# Patient Record
Sex: Female | Born: 1975 | Race: Black or African American | Hispanic: No | Marital: Single | Smoking: Never smoker
Health system: Southern US, Community
[De-identification: ages and names within clinical notes are randomized; demographics above are authoritative.]

## PROBLEM LIST (undated history)

## (undated) ENCOUNTER — Ambulatory Visit: Admission: EM | Payer: Medicaid Other

## (undated) DIAGNOSIS — Z803 Family history of malignant neoplasm of breast: Secondary | ICD-10-CM

## (undated) DIAGNOSIS — K589 Irritable bowel syndrome without diarrhea: Secondary | ICD-10-CM

## (undated) DIAGNOSIS — A6920 Lyme disease, unspecified: Secondary | ICD-10-CM

## (undated) DIAGNOSIS — Z8041 Family history of malignant neoplasm of ovary: Secondary | ICD-10-CM

## (undated) DIAGNOSIS — R079 Chest pain, unspecified: Secondary | ICD-10-CM

## (undated) DIAGNOSIS — K644 Residual hemorrhoidal skin tags: Secondary | ICD-10-CM

## (undated) DIAGNOSIS — J45909 Unspecified asthma, uncomplicated: Secondary | ICD-10-CM

## (undated) DIAGNOSIS — K219 Gastro-esophageal reflux disease without esophagitis: Secondary | ICD-10-CM

## (undated) HISTORY — DX: Residual hemorrhoidal skin tags: K64.4

## (undated) HISTORY — PX: TUBAL LIGATION: SHX77

## (undated) HISTORY — DX: Gastro-esophageal reflux disease without esophagitis: K21.9

## (undated) HISTORY — DX: Irritable bowel syndrome, unspecified: K58.9

## (undated) HISTORY — PX: CHOLECYSTECTOMY: SHX55

## (undated) HISTORY — DX: Family history of malignant neoplasm of breast: Z80.3

## (undated) HISTORY — DX: Lyme disease, unspecified: A69.20

## (undated) HISTORY — DX: Family history of malignant neoplasm of ovary: Z80.41

## (undated) HISTORY — DX: Chest pain, unspecified: R07.9

---

## 2017-10-11 LAB — HM HIV SCREENING LAB: HM HIV Screening: NEGATIVE

## 2017-12-25 ENCOUNTER — Emergency Department: Payer: Self-pay

## 2017-12-25 ENCOUNTER — Encounter: Payer: Self-pay | Admitting: Emergency Medicine

## 2017-12-25 ENCOUNTER — Other Ambulatory Visit: Payer: Self-pay

## 2017-12-25 ENCOUNTER — Emergency Department
Admission: EM | Admit: 2017-12-25 | Discharge: 2017-12-25 | Disposition: A | Discharge status: Home / Self Care | Payer: Self-pay | Attending: Emergency Medicine | Admitting: Emergency Medicine

## 2017-12-25 DIAGNOSIS — L732 Hidradenitis suppurativa: Secondary | ICD-10-CM | POA: Insufficient documentation

## 2017-12-25 DIAGNOSIS — J069 Acute upper respiratory infection, unspecified: Secondary | ICD-10-CM | POA: Insufficient documentation

## 2017-12-25 MED ORDER — BENZONATATE 100 MG PO CAPS: 100.0000 mg | ORAL_CAPSULE | Freq: Three times a day (TID) | ORAL | 0 refills | Status: DC | PRN

## 2017-12-25 MED ORDER — LIDOCAINE HCL (PF) 1 % IJ SOLN
5.0000 mL | Freq: Once | INTRAMUSCULAR | Status: AC
  Administered 2017-12-25: 5 mL via INTRADERMAL
  Filled 2017-12-25: qty 5

## 2017-12-25 MED ORDER — AZITHROMYCIN 250 MG PO TABS: ORAL_TABLET | ORAL | 0 refills | Status: DC

## 2017-12-25 MED ORDER — CLINDAMYCIN HCL 300 MG PO CAPS: 300.0000 mg | ORAL_CAPSULE | Freq: Three times a day (TID) | ORAL | 0 refills | Status: AC

## 2017-12-25 NOTE — ED Triage Notes (Signed)
First Nurse Note:  C/O productive cough of thick green sputum and right axilla tenderness / abscess.

## 2017-12-25 NOTE — ED Provider Notes (Signed)
[facility] Emergency Department Provider Note  ____________________________________________  Time seen: Approximately 1:34 PM  I have reviewed the triage vital signs and the nursing notes.   HISTORY  Chief Complaint Cough and Abscess    HPI Meredith Harris is a 42 y.o. female that presents to the emergency department for evaluation of productive cough with yellow and green sputum for 1 week and painful mass under right axilla for 3 days.  Patient has been using an albuterol inhaler without relief.  She had a mammogram done and was told that she has fluid pockets under her arms but has not had problems until now.  She denies fever, nausea, vomiting, abdominal pain.  History reviewed. No pertinent past medical history.  There are no active problems to display for this patient.   History reviewed. No pertinent surgical history.  Prior to Admission medications   Medication Sig Start Date End Date Taking? Authorizing Provider  azithromycin (ZITHROMAX Z-PAK) 250 MG tablet Take 2 tablets (500 mg) on  Day 1,  followed by 1 tablet (250 mg) once daily on Days 2 through 5. 12/25/17   Enid Derry, PA-C  benzonatate (TESSALON PERLES) 100 MG capsule Take 1 capsule (100 mg total) by mouth 3 (three) times daily as needed for cough. 12/25/17 12/25/18  Enid Derry, PA-C  clindamycin (CLEOCIN) 300 MG capsule Take 1 capsule (300 mg total) by mouth 3 (three) times daily for 10 days. 12/25/17 01/04/18  Enid Derry, PA-C    Allergies Penicillins and Sulfa antibiotics  No family history on file.  Social History Social History   Tobacco Use  . Smoking status: Not on file  Substance Use Topics  . Alcohol use: Not on file  . Drug use: Not on file     Review of Systems  Constitutional: No fever/chills ENT: Negative for congestion and rhinorrhea. Cardiovascular: No chest pain. Respiratory: Positive for cough.  Gastrointestinal: No abdominal pain.  No nausea, no  vomiting.  No diarrhea.  No constipation. Skin: Negative for  abrasions, lacerations, ecchymosis.   ____________________________________________   PHYSICAL EXAM:  VITAL SIGNS: ED Triage Vitals  Enc Vitals Group     BP 12/25/17 1248 (!) 158/95     Pulse Rate 12/25/17 1248 77     Resp 12/25/17 1248 20     Temp 12/25/17 1248 99.7 F (37.6 C)     Temp Source 12/25/17 1248 Oral     SpO2 12/25/17 1248 100 %     Weight 12/25/17 1249 199 lb (90.3 kg)     Height 12/25/17 1249 5\' 4"  (1.626 m)     Head Circumference --      Peak Flow --      Pain Score 12/25/17 1248 10     Pain Loc --      Pain Edu? --      Excl. in GC? --      Constitutional: Alert and oriented. Well appearing and in no acute distress. Eyes: Conjunctivae are normal. PERRL. EOMI. No discharge. Head: Atraumatic. ENT: No frontal and maxillary sinus tenderness.      Ears: Tympanic membranes pearly gray with good landmarks. No discharge.      Nose: No congestion/rhinnorhea.      Mouth/Throat: Mucous membranes are moist. Oropharynx non-erythematous. Tonsils not enlarged. No exudates. Uvula midline. Neck: No stridor.   Hematological/Lymphatic/Immunilogical: No cervical lymphadenopathy. Cardiovascular: Normal rate, regular rhythm.  Good peripheral circulation. Respiratory: Normal respiratory effort without tachypnea or retractions. Lungs CTAB. Good air entry to  the bases with no decreased or absent breath sounds. Gastrointestinal: Bowel sounds 4 quadrants. Soft and nontender to palpation. No guarding or rigidity. No palpable masses. No distention. Musculoskeletal: Full range of motion to all extremities. No gross deformities appreciated. Neurologic:  Normal speech and language. No gross focal neurologic deficits are appreciated.  Skin:  Skin is warm, dry. 1/2cm by 1/2cm fluctuant mass to left axilla.    ____________________________________________   LABS (all labs ordered are listed, but only abnormal results are  displayed)  Labs Reviewed - No data to display ____________________________________________  EKG   ____________________________________________  RADIOLOGY Lexine BatonI, Halo Shevlin, personally viewed and evaluated these images (plain radiographs) as part of my medical decision making, as well as reviewing the written report by the radiologist.  Dg Chest 2 View  Result Date: 12/25/2017 CLINICAL DATA:  Cough and productive sputum. EXAM: CHEST - 2 VIEW COMPARISON:  None. FINDINGS: Midline trachea.  Normal heart size and mediastinal contours. Sharp costophrenic angles.  No pneumothorax.  Clear lungs. Lateral view degraded by patient arm position. Cholecystectomy clips. IMPRESSION: No active cardiopulmonary disease. Electronically Signed   By: Jeronimo GreavesKyle  Talbot M.D.   On: 12/25/2017 13:54    ____________________________________________    PROCEDURES  Procedure(s) performed:    Procedures  INCISION AND DRAINAGE Performed by: Enid DerryAshley Suleyman Ehrman Consent: Verbal consent obtained. Risks and benefits: risks, benefits and alternatives were discussed Type: abscess  Body area: axilla  Anesthesia: local infiltration  Incision was made with a scalpel.  Local anesthetic: lidocaine 1% without epinephrine  Anesthetic total: 2 ml  Complexity: complex Blunt dissection to break up loculations  Drainage: purulent  Drainage amount: 2ccs  Packing material: 1/4 in iodoform gauze  Patient tolerance: Patient tolerated the procedure well with no immediate complications.   Medications  lidocaine (PF) (XYLOCAINE) 1 % injection 5 mL (5 mLs Intradermal Given by Other 12/25/17 1541)     ____________________________________________   INITIAL IMPRESSION / ASSESSMENT AND PLAN / ED COURSE  Pertinent labs & imaging results that were available during my care of the patient were reviewed by me and considered in my medical decision making (see chart for details).  Review of the [place] CSRS was performed in  accordance of the [facility] prior to dispensing any controlled drugs.   Patient's diagnosis is consistent with hydradenitis and URI with cough and congestions. Vital signs and exam are reassuring.  Chest x-ray negative for acute cardiopulmonary processes.  Abscess was drained and packed in ED. Patient appears well and is staying well hydrated. Patient will be discharged home with prescriptions for azithromycin, clindamycin, and tessalon pearles. Patient is to follow up with PCP as needed or otherwise directed. Patient is given ED precautions to return to the ED for any worsening or new symptoms.     ____________________________________________  FINAL CLINICAL IMPRESSION(S) / ED DIAGNOSES  Final diagnoses:  Hydradenitis  URI with cough and congestion      NEW MEDICATIONS STARTED DURING THIS VISIT:  ED Discharge Orders        Ordered    azithromycin (ZITHROMAX Z-PAK) 250 MG tablet     12/25/17 1512    clindamycin (CLEOCIN) 300 MG capsule  3 times daily     12/25/17 1512    benzonatate (TESSALON PERLES) 100 MG capsule  3 times daily PRN     12/25/17 1512          This chart was dictated using voice recognition software/Dragon. Despite best efforts to proofread, errors can occur which can change  the meaning. Any change was purely unintentional.    Enid Derry, PA-C 12/25/17 1608    Sharyn Creamer, MD 12/26/17 [phone]

## 2017-12-25 NOTE — ED Triage Notes (Signed)
Cough x 1 week. Sore under r arm x 2 days.

## 2018-03-09 ENCOUNTER — Ambulatory Visit: Payer: Self-pay | Admitting: Surgery

## 2018-03-10 ENCOUNTER — Ambulatory Visit: Payer: Self-pay | Admitting: Surgery

## 2018-03-15 ENCOUNTER — Telehealth: Payer: Self-pay | Admitting: General Practice

## 2018-03-15 NOTE — Telephone Encounter (Signed)
Unable to leave a message for the patient to call the office. I have mailed a letter for the patient to contact our office.

## 2018-03-16 ENCOUNTER — Other Ambulatory Visit: Payer: Self-pay

## 2018-03-16 ENCOUNTER — Emergency Department: Payer: 59

## 2018-03-16 ENCOUNTER — Encounter: Payer: Self-pay | Admitting: Emergency Medicine

## 2018-03-16 ENCOUNTER — Emergency Department
Admission: EM | Admit: 2018-03-16 | Discharge: 2018-03-17 | Disposition: A | Discharge status: Home / Self Care | Payer: 59 | Attending: Emergency Medicine | Admitting: Emergency Medicine

## 2018-03-16 DIAGNOSIS — R079 Chest pain, unspecified: Secondary | ICD-10-CM | POA: Diagnosis present

## 2018-03-16 LAB — BASIC METABOLIC PANEL
ANION GAP: 11 (ref 5–15)
BUN: 13 mg/dL (ref 6–20)
CHLORIDE: 104 mmol/L (ref 101–111)
CO2: 24 mmol/L (ref 22–32)
Calcium: 9.1 mg/dL (ref 8.9–10.3)
Creatinine, Ser: 1.03 mg/dL — ABNORMAL HIGH (ref 0.44–1.00)
GFR calc Af Amer: >60 mL/min (ref >60)
Glucose, Bld: 104 mg/dL — ABNORMAL HIGH (ref 65–99)
POTASSIUM: 3.3 mmol/L — AB (ref 3.5–5.1)
SODIUM: 139 mmol/L (ref 135–145)

## 2018-03-16 LAB — TROPONIN I: Troponin I: <0.03 ng/mL (ref <0.03)

## 2018-03-16 LAB — CBC
HEMATOCRIT: 38 % (ref 35.0–47.0)
HEMOGLOBIN: 12.8 g/dL (ref 12.0–16.0)
MCH: 29.6 pg (ref 26.0–34.0)
MCHC: 33.7 g/dL (ref 32.0–36.0)
MCV: 87.9 fL (ref 80.0–100.0)
Platelets: 359 10*3/uL (ref 150–440)
RBC: 4.33 MIL/uL (ref 3.80–5.20)
RDW: 13.7 % (ref 11.5–14.5)
WBC: 15.3 10*3/uL — AB (ref 3.6–11.0)

## 2018-03-16 MED ORDER — IOPAMIDOL (ISOVUE-370) INJECTION 76%
100.0000 mL | Freq: Once | INTRAVENOUS | Status: AC | PRN
  Administered 2018-03-16: 100 mL via INTRAVENOUS

## 2018-03-16 MED ORDER — SODIUM CHLORIDE 0.9 % IV BOLUS
1000.0000 mL | Freq: Once | INTRAVENOUS | Status: AC
  Administered 2018-03-16: 1000 mL via INTRAVENOUS

## 2018-03-16 MED ORDER — POTASSIUM CHLORIDE 20 MEQ PO PACK
40.0000 meq | PACK | Freq: Two times a day (BID) | ORAL | Status: DC
  Administered 2018-03-16: 40 meq via ORAL
  Filled 2018-03-16: qty 2

## 2018-03-16 NOTE — ED Triage Notes (Addendum)
Patient ambulatory to triage with steady gait, without difficulty, pt appears very anxious; Pt reports while at work, had sudden onset "feeling cold"; with upper chest tightness, pain that goes beneath right breast/axillae/back accomp by generalized HA and SHOB; denies hx of same

## 2018-03-17 LAB — TROPONIN I: Troponin I: <0.03 ng/mL (ref <0.03)

## 2018-03-17 MED ORDER — LORAZEPAM 1 MG PO TABS
ORAL_TABLET | ORAL | Status: AC
  Filled 2018-03-17: qty 1

## 2018-03-17 MED ORDER — ASPIRIN 81 MG PO CHEW
324.0000 mg | CHEWABLE_TABLET | Freq: Once | ORAL | Status: AC
  Administered 2018-03-17: 324 mg via ORAL
  Filled 2018-03-17: qty 4

## 2018-03-17 MED ORDER — LORAZEPAM 1 MG PO TABS
1.0000 mg | ORAL_TABLET | Freq: Once | ORAL | Status: AC
  Administered 2018-03-17: 1 mg via ORAL

## 2018-03-17 MED ORDER — KETOROLAC TROMETHAMINE 30 MG/ML IJ SOLN
30.0000 mg | Freq: Once | INTRAMUSCULAR | Status: AC
  Administered 2018-03-17: 30 mg via INTRAVENOUS
  Filled 2018-03-17: qty 1

## 2018-03-17 NOTE — ED Notes (Signed)
MD at bedside to discuss results from scan

## 2018-03-17 NOTE — ED Provider Notes (Signed)
[facility] Emergency Department Provider Note    First MD Initiated Contact with Patient 03/16/18 2304     (approximate)  I have reviewed the triage vital signs and the nursing notes.   HISTORY  Chief Complaint Chest Pain    HPI Meredith Harris is a 42 y.o. female presents to the emergency department acute onset central chest tightness radiating to left axilla and back.  Patient also admits to shortness of breath and headache.  Patient also admits to bilateral arm, periorbital numbness tingling and bluish discoloration during the event.  Patient states her current chest discomfort is 7 out of 10.  Patient denies any aggravating or alleviating factors.  Past medical history None There are no active problems to display for this patient.   Past Surgical History:  Procedure Laterality Date  . CHOLECYSTECTOMY      Prior to Admission medications   Medication Sig Start Date End Date Taking? Authorizing Provider  azithromycin (ZITHROMAX Z-PAK) 250 MG tablet Take 2 tablets (500 mg) on  Day 1,  followed by 1 tablet (250 mg) once daily on Days 2 through 5. 12/25/17   Enid DerryWagner, Ashley, PA-C  benzonatate (TESSALON PERLES) 100 MG capsule Take 1 capsule (100 mg total) by mouth 3 (three) times daily as needed for cough. 12/25/17 12/25/18  Enid DerryWagner, Ashley, PA-C    Allergies Penicillins and Sulfa antibiotics  No family history on file.  Social History Social History   Tobacco Use  . Smoking status: Never Smoker  . Smokeless tobacco: Never Used  Substance Use Topics  . Alcohol use: Not on file  . Drug use: Not on file    Review of Systems Constitutional: No fever/chills Eyes: No visual changes. ENT: No sore throat. Cardiovascular: Positive chest pain. Respiratory: Denies shortness of breath. Gastrointestinal: No abdominal pain.  No nausea, no vomiting.  No diarrhea.  No constipation. Genitourinary: Negative for dysuria. Musculoskeletal: Negative for neck  pain.  Negative for back pain. Integumentary: Negative for rash. Neurological: Negative for headaches, focal weakness or numbness.  ____________________________________________   PHYSICAL EXAM:  VITAL SIGNS: ED Triage Vitals  Enc Vitals Group     BP 03/16/18 2136 (!) 159/100     Pulse Rate 03/16/18 2136 (!) 110     Resp 03/16/18 2136 (!) 28     Temp 03/16/18 2136 99.7 F (37.6 C)     Temp Source 03/16/18 2136 Oral     SpO2 03/16/18 2136 98 %     Weight 03/16/18 2136 86.6 kg (191 lb)     Height 03/16/18 2136 1.651 m (5\' 5" )     Head Circumference --      Peak Flow --      Pain Score 03/16/18 2139 9     Pain Loc --      Pain Edu? --      Excl. in GC? --     Constitutional: Alert and oriented. Well appearing and in no acute distress. Eyes: Conjunctivae are normal. PERRL. EOMI. Head: Atraumatic. Mouth/Throat: Mucous membranes are moist.  Oropharynx non-erythematous. Neck: No stridor.   Cardiovascular: Tachycardia, regular rhythm. Good peripheral circulation. Grossly normal heart sounds. Respiratory: Tachypnea. no retractions. Lungs CTAB. Gastrointestinal: Soft and nontender. No distention.   Musculoskeletal: No lower extremity tenderness nor edema. No gross deformities of extremities. Neurologic:  Normal speech and language. No gross focal neurologic deficits are appreciated.  Skin:  Skin is warm, dry and intact. No rash noted. Psychiatric: Anxious affect . Speech and behavior  are normal.  ____________________________________________   LABS (all labs ordered are listed, but only abnormal results are displayed)  Labs Reviewed  BASIC METABOLIC PANEL - Abnormal; Notable for the following components:      Result Value   Potassium 3.3 (*)    Glucose, Bld 104 (*)    Creatinine, Ser 1.03 (*)    All other components within normal limits  CBC - Abnormal; Notable for the following components:   WBC 15.3 (*)    All other components within normal limits  TROPONIN I    ____________________________________________  EKG  ED ECG REPORT I, [place] Maelynn Moroney, the attending physician, personally viewed and interpreted this ECG.   Date: 03/17/2018  EKG Time: 9:41 PM  Rate: 110  Rhythm: Sinus tachycardia  Axis: Normal  Intervals: Normal  ST&T Change: None  ____________________________________________  RADIOLOGY I, [place] Chancy Claros, personally viewed and evaluated these images (plain radiographs) as part of my medical decision making, as well as reviewing the written report by the radiologist.  ED MD interpretation:  No active cardiopulmonary disease per radiologist  Official radiology report(s): Dg Chest 2 View  Result Date: 03/16/2018 CLINICAL DATA:  Sudden onset of feeling cold with upper chest tightness. EXAM: CHEST - 2 VIEW COMPARISON:  12/25/2017 FINDINGS: The heart size and mediastinal contours are within normal limits. Low lung volumes with slight crowding of interstitial lung markings. No alveolar consolidation, CHF, effusion or pneumothorax. The visualized skeletal structures are unremarkable. IMPRESSION: No active cardiopulmonary disease. Electronically Signed   By: Tollie Eth M.D.   On: 03/16/2018 22:02    __________________________________________ Procedures   ____________________________________________   INITIAL IMPRESSION / ASSESSMENT AND PLAN / ED COURSE  As part of my medical decision making, I reviewed the following data within the electronic MEDICAL RECORD NUMBER  41 year old female presenting with above-stated history and physical exam secondary to chest pain.  Consider the possibility of CAD and as such EKG and troponin was performed x2 which was negative.  In addition to consider the possibility of a pulmonary emboli or aortic aneurysm and a stat CT Angiulli chest was performed which revealed no evidence of PE or aortic aneurysm.  Patient very anxious during my evaluation and as such patient was given Ativan 1  mg. ____________________________________________  FINAL CLINICAL IMPRESSION(S) / ED DIAGNOSES  Final diagnoses:  Chest pain, unspecified type     MEDICATIONS GIVEN DURING THIS VISIT:  Medications  potassium chloride (KLOR-CON) packet 40 mEq (40 mEq Oral Given 03/16/18 2333)  sodium chloride 0.9 % bolus 1,000 mL (1,000 mLs Intravenous New Bag/Given 03/16/18 2333)  iopamidol (ISOVUE-370) 76 % injection 100 mL (100 mLs Intravenous Contrast Given 03/16/18 2348)     ED Discharge Orders    None       Note:  This document was prepared using Dragon voice recognition software and may include unintentional dictation errors.    Darci Current, MD 03/17/18 [phone]

## 2018-05-09 ENCOUNTER — Encounter: Payer: Self-pay | Admitting: Emergency Medicine

## 2018-05-09 ENCOUNTER — Emergency Department: Payer: 59

## 2018-05-09 ENCOUNTER — Other Ambulatory Visit: Payer: Self-pay

## 2018-05-09 ENCOUNTER — Inpatient Hospital Stay
Admission: EM | Admit: 2018-05-09 | Discharge: 2018-05-12 | DRG: 872 | Disposition: A | Discharge status: Home / Self Care | Payer: 59 | Attending: Internal Medicine | Admitting: Internal Medicine

## 2018-05-09 DIAGNOSIS — L732 Hidradenitis suppurativa: Secondary | ICD-10-CM | POA: Diagnosis present

## 2018-05-09 DIAGNOSIS — Z885 Allergy status to narcotic agent status: Secondary | ICD-10-CM | POA: Diagnosis not present

## 2018-05-09 DIAGNOSIS — N644 Mastodynia: Secondary | ICD-10-CM

## 2018-05-09 DIAGNOSIS — Z880 Allergy status to penicillin: Secondary | ICD-10-CM | POA: Diagnosis not present

## 2018-05-09 DIAGNOSIS — J45909 Unspecified asthma, uncomplicated: Secondary | ICD-10-CM | POA: Diagnosis present

## 2018-05-09 DIAGNOSIS — N61 Mastitis without abscess: Secondary | ICD-10-CM | POA: Diagnosis present

## 2018-05-09 DIAGNOSIS — A419 Sepsis, unspecified organism: Principal | ICD-10-CM | POA: Diagnosis present

## 2018-05-09 HISTORY — DX: Unspecified asthma, uncomplicated: J45.909

## 2018-05-09 LAB — COMPREHENSIVE METABOLIC PANEL
ALT: 18 U/L (ref 0–44)
AST: 25 U/L (ref 15–41)
Albumin: 4.6 g/dL (ref 3.5–5.0)
Alkaline Phosphatase: 67 U/L (ref 38–126)
Anion gap: 9 (ref 5–15)
BUN: 11 mg/dL (ref 6–20)
CHLORIDE: 107 mmol/L (ref 98–111)
CO2: 23 mmol/L (ref 22–32)
CREATININE: 0.99 mg/dL (ref 0.44–1.00)
Calcium: 9.5 mg/dL (ref 8.9–10.3)
GFR calc non Af Amer: >60 mL/min (ref >60)
Glucose, Bld: 119 mg/dL — ABNORMAL HIGH (ref 70–99)
POTASSIUM: 3.3 mmol/L — AB (ref 3.5–5.1)
SODIUM: 139 mmol/L (ref 135–145)
Total Bilirubin: 0.7 mg/dL (ref 0.3–1.2)
Total Protein: 7.9 g/dL (ref 6.5–8.1)

## 2018-05-09 LAB — URINE DRUG SCREEN, QUALITATIVE (ARMC ONLY)
AMPHETAMINES, UR SCREEN: NOT DETECTED
Barbiturates, Ur Screen: NOT DETECTED
CANNABINOID 50 NG, UR ~~LOC~~: NOT DETECTED
Cocaine Metabolite,Ur ~~LOC~~: NOT DETECTED
MDMA (ECSTASY) UR SCREEN: NOT DETECTED
Methadone Scn, Ur: NOT DETECTED
Opiate, Ur Screen: NOT DETECTED
PHENCYCLIDINE (PCP) UR S: NOT DETECTED
Tricyclic, Ur Screen: NOT DETECTED

## 2018-05-09 LAB — URINALYSIS, COMPLETE (UACMP) WITH MICROSCOPIC
BACTERIA UA: NONE SEEN
BILIRUBIN URINE: NEGATIVE
Glucose, UA: NEGATIVE mg/dL
KETONES UR: NEGATIVE mg/dL
Leukocytes, UA: NEGATIVE
Nitrite: NEGATIVE
PH: 6 (ref 5.0–8.0)
PROTEIN: NEGATIVE mg/dL
Specific Gravity, Urine: 1.015 (ref 1.005–1.030)

## 2018-05-09 LAB — POCT PREGNANCY, URINE: Preg Test, Ur: NEGATIVE

## 2018-05-09 LAB — TROPONIN I: Troponin I: <0.03 ng/mL (ref <0.03)

## 2018-05-09 LAB — CBC WITH DIFFERENTIAL/PLATELET
BASOS ABS: 0.1 10*3/uL (ref 0–0.1)
Basophils Relative: 1 %
Eosinophils Absolute: 0 10*3/uL (ref 0–0.7)
Eosinophils Relative: 0 %
HEMATOCRIT: 38.3 % (ref 35.0–47.0)
HEMOGLOBIN: 13.4 g/dL (ref 12.0–16.0)
LYMPHS ABS: 0.5 10*3/uL — AB (ref 1.0–3.6)
LYMPHS PCT: 4 %
MCH: 30.5 pg (ref 26.0–34.0)
MCHC: 34.9 g/dL (ref 32.0–36.0)
MCV: 87.3 fL (ref 80.0–100.0)
Monocytes Absolute: 0.7 10*3/uL (ref 0.2–0.9)
Monocytes Relative: 4 %
NEUTROS ABS: 14.2 10*3/uL — AB (ref 1.4–6.5)
NEUTROS PCT: 91 %
PLATELETS: 284 10*3/uL (ref 150–440)
RBC: 4.39 MIL/uL (ref 3.80–5.20)
RDW: 14 % (ref 11.5–14.5)
WBC: 15.5 10*3/uL — ABNORMAL HIGH (ref 3.6–11.0)

## 2018-05-09 LAB — INFLUENZA PANEL BY PCR (TYPE A & B)
INFLAPCR: NEGATIVE
INFLBPCR: NEGATIVE

## 2018-05-09 LAB — LACTIC ACID, PLASMA
LACTIC ACID, VENOUS: 2.3 mmol/L — AB (ref 0.5–1.9)
LACTIC ACID, VENOUS: 1.6 mmol/L (ref 0.5–1.9)

## 2018-05-09 MED ORDER — IBUPROFEN 400 MG PO TABS
400.0000 mg | ORAL_TABLET | Freq: Four times a day (QID) | ORAL | Status: DC | PRN
  Administered 2018-05-09 – 2018-05-10 (×2): 400 mg via ORAL
  Filled 2018-05-09 (×2): qty 1

## 2018-05-09 MED ORDER — ONDANSETRON HCL 4 MG/2ML IJ SOLN
INTRAMUSCULAR | Status: AC
  Administered 2018-05-09: 4 mg via INTRAVENOUS
  Filled 2018-05-09: qty 2

## 2018-05-09 MED ORDER — ACETAMINOPHEN 650 MG RE SUPP: 650.0000 mg | Freq: Four times a day (QID) | RECTAL | Status: DC | PRN

## 2018-05-09 MED ORDER — FENTANYL CITRATE (PF) 100 MCG/2ML IJ SOLN
100.0000 ug | Freq: Once | INTRAMUSCULAR | Status: AC
  Administered 2018-05-09: 100 ug via INTRAVENOUS

## 2018-05-09 MED ORDER — ONDANSETRON HCL 4 MG PO TABS: 4.0000 mg | ORAL_TABLET | Freq: Four times a day (QID) | ORAL | Status: DC | PRN

## 2018-05-09 MED ORDER — ONDANSETRON HCL 4 MG/2ML IJ SOLN: 4.0000 mg | Freq: Four times a day (QID) | INTRAMUSCULAR | Status: DC | PRN

## 2018-05-09 MED ORDER — FENTANYL CITRATE (PF) 100 MCG/2ML IJ SOLN
INTRAMUSCULAR | Status: AC
  Administered 2018-05-09: 100 ug via INTRAVENOUS
  Filled 2018-05-09: qty 2

## 2018-05-09 MED ORDER — SODIUM CHLORIDE 0.9 % IV SOLN
INTRAVENOUS | Status: DC
  Administered 2018-05-09 – 2018-05-12 (×5): via INTRAVENOUS

## 2018-05-09 MED ORDER — SODIUM CHLORIDE 0.9 % IV BOLUS
1000.0000 mL | Freq: Once | INTRAVENOUS | Status: AC
  Administered 2018-05-09: 1000 mL via INTRAVENOUS

## 2018-05-09 MED ORDER — SODIUM CHLORIDE 0.9 % IV SOLN
1.0000 g | Freq: Once | INTRAVENOUS | Status: AC
  Administered 2018-05-09: 1 g via INTRAVENOUS
  Filled 2018-05-09: qty 10

## 2018-05-09 MED ORDER — CLINDAMYCIN PHOSPHATE 300 MG/50ML IV SOLN
300.0000 mg | Freq: Four times a day (QID) | INTRAVENOUS | Status: DC
  Administered 2018-05-09 – 2018-05-12 (×12): 300 mg via INTRAVENOUS
  Filled 2018-05-09 (×16): qty 50

## 2018-05-09 MED ORDER — ACETAMINOPHEN 500 MG PO TABS
1000.0000 mg | ORAL_TABLET | Freq: Once | ORAL | Status: AC
  Administered 2018-05-09: 1000 mg via ORAL
  Filled 2018-05-09: qty 2

## 2018-05-09 MED ORDER — IOPAMIDOL (ISOVUE-370) INJECTION 76%
75.0000 mL | Freq: Once | INTRAVENOUS | Status: AC | PRN
  Administered 2018-05-09: 75 mL via INTRAVENOUS

## 2018-05-09 MED ORDER — ENOXAPARIN SODIUM 40 MG/0.4ML ~~LOC~~ SOLN
40.0000 mg | SUBCUTANEOUS | Status: DC
  Administered 2018-05-09 – 2018-05-11 (×3): 40 mg via SUBCUTANEOUS
  Filled 2018-05-09 (×3): qty 0.4

## 2018-05-09 MED ORDER — ACETAMINOPHEN 325 MG PO TABS
650.0000 mg | ORAL_TABLET | Freq: Four times a day (QID) | ORAL | Status: DC | PRN
  Administered 2018-05-10 – 2018-05-11 (×3): 650 mg via ORAL
  Filled 2018-05-09 (×3): qty 2

## 2018-05-09 MED ORDER — CLINDAMYCIN PHOSPHATE 900 MG/50ML IV SOLN
900.0000 mg | Freq: Once | INTRAVENOUS | Status: AC
  Administered 2018-05-09: 900 mg via INTRAVENOUS
  Filled 2018-05-09: qty 50

## 2018-05-09 MED ORDER — VANCOMYCIN HCL IN DEXTROSE 1-5 GM/200ML-% IV SOLN
1000.0000 mg | Freq: Once | INTRAVENOUS | Status: AC
  Administered 2018-05-09: 1000 mg via INTRAVENOUS
  Filled 2018-05-09: qty 200

## 2018-05-09 MED ORDER — VANCOMYCIN HCL IN DEXTROSE 1-5 GM/200ML-% IV SOLN
1000.0000 mg | Freq: Two times a day (BID) | INTRAVENOUS | Status: DC
  Administered 2018-05-09 – 2018-05-10 (×3): 1000 mg via INTRAVENOUS
  Filled 2018-05-09 (×5): qty 200

## 2018-05-09 MED ORDER — ONDANSETRON HCL 4 MG/2ML IJ SOLN
4.0000 mg | Freq: Once | INTRAMUSCULAR | Status: AC
  Administered 2018-05-09: 4 mg via INTRAVENOUS

## 2018-05-09 NOTE — H&P (Signed)
[facility] NAME: Meredith Harris    MR#:  [id number]  DATE OF BIRTH:  1975-11-12  DATE OF ADMISSION:  05/09/2018  PRIMARY CARE PHYSICIAN: Patient, No Pcp Per   REQUESTING/REFERRING PHYSICIAN: Dr. Merlyn Lot  CHIEF COMPLAINT:   Chief Complaint  Patient presents with  . Chills  . Back Pain  . Cyst    HISTORY OF PRESENT ILLNESS:  Meredith Harris  is a 42 y.o. female with a known history of asthma who presents to the hospital due to chills, fever, generalized body aches.  Patient says she was in her usual state of health when this morning patient developed significant body aches, chills and was noted to have a fever of 103.  Patient denies any upper respiratory symptoms of cough, congestion, nasal discharge or sore throat.  She also denies any GI symptoms of abdominal pain, nausea, vomiting.  She denies any diarrhea.  She presents to the ER and was noted to have sepsis criteria given her elevated lactic acid, leukocytosis fever of 103.  Her chest x-ray/CT chest abdomen pelvis is negative for acute pathology.  She does say that she has some pain on her right breast area and on examination her right axilla is suggestive of some mild hidradenitis with some drainage.  Hospitalist services were contacted for admission.  PAST MEDICAL HISTORY:   Past Medical History:  Diagnosis Date  . Asthma     PAST SURGICAL HISTORY:   Past Surgical History:  Procedure Laterality Date  . CHOLECYSTECTOMY      SOCIAL HISTORY:   Social History   Tobacco Use  . Smoking status: Never Smoker  . Smokeless tobacco: Never Used  Substance Use Topics  . Alcohol use: Yes    Comment: socially    FAMILY HISTORY:   Family History  Problem Relation Age of Onset  . Hypertension Mother   . Stroke Mother   . Hypertension Father     DRUG ALLERGIES:   Allergies  Allergen Reactions  . Morphine And Related Other (See Comments)    Restless legs  .  Penicillins Hives    Has patient had a PCN reaction causing immediate rash, facial/tongue/throat swelling, SOB or lightheadedness with hypotension: Yes Has patient had a PCN reaction causing severe rash involving mucus membranes or skin necrosis: Unknown Has patient had a PCN reaction that required hospitalization: Unknown Has patient had a PCN reaction occurring within the last 10 years: Unknown If all of the above answers are "NO", then may proceed with Cephalosporin use.   . Sulfa Antibiotics Hives    REVIEW OF SYSTEMS:   Review of Systems  Constitutional: Positive for chills and fever. Negative for weight loss.  HENT: Negative for congestion, nosebleeds and tinnitus.   Eyes: Negative for blurred vision, double vision and redness.  Respiratory: Negative for cough, hemoptysis and shortness of breath.   Cardiovascular: Negative for chest pain, orthopnea, leg swelling and PND.  Gastrointestinal: Negative for abdominal pain, diarrhea, melena, nausea and vomiting.  Genitourinary: Negative for dysuria, hematuria and urgency.  Musculoskeletal: Negative for falls and joint pain.  Neurological: Negative for dizziness, tingling, sensory change, focal weakness, seizures, weakness and headaches.  Endo/Heme/Allergies: Negative for polydipsia. Does not bruise/bleed easily.  Psychiatric/Behavioral: Negative for depression and memory loss. The patient is not nervous/anxious.     MEDICATIONS AT HOME:   Prior to Admission medications   Medication Sig Start Date End Date Taking? Authorizing Provider  azithromycin (ZITHROMAX Z-PAK)  250 MG tablet Take 2 tablets (500 mg) on  Day 1,  followed by 1 tablet (250 mg) once daily on Days 2 through 5. Patient not taking: Reported on 05/09/2018 12/25/17   Laban Emperor, PA-C  benzonatate (TESSALON PERLES) 100 MG capsule Take 1 capsule (100 mg total) by mouth 3 (three) times daily as needed for cough. Patient not taking: Reported on 05/09/2018 12/25/17 12/25/18   Laban Emperor, PA-C      VITAL SIGNS:  Blood pressure 100/60, pulse 92, temperature 98.9 F (37.2 C), temperature source Oral, resp. rate 19, height _0  (1.626 m), weight 86.6 kg, last menstrual period 04/28/2018, SpO2 100 %.  PHYSICAL EXAMINATION:  Physical Exam  GENERAL:  42 y.o.-year-old patient lying in the bed with no acute distress.  EYES: Pupils equal, round, reactive to light and accommodation. No scleral icterus. Extraocular muscles intact.  HEENT: Head atraumatic, normocephalic. Oropharynx and nasopharynx clear. No oropharyngeal erythema, moist oral mucosa  NECK:  Supple, no jugular venous distention. No thyroid enlargement, no tenderness.  LUNGS: Normal breath sounds bilaterally, no wheezing, rales, rhonchi. No use of accessory muscles of respiration.  CARDIOVASCULAR: S1, S2 RRR. No murmurs, rubs, gallops, clicks.  ABDOMEN: Soft, nontender, nondistended. Bowel sounds present. No organomegaly or mass.  EXTREMITIES: No pedal edema, cyanosis, or clubbing. + 2 pedal & radial pulses b/l.   NEUROLOGIC: Cranial nerves II through XII are intact. No focal Motor or sensory deficits appreciated b/l PSYCHIATRIC: The patient is alert and oriented x 3.  SKIN: No obvious rash, lesion, or ulcer.  Right axillary hidradenitis with some drainage.  Right breast has some mild erythema but no acute drainage or mastitis or nipple discharge noted.  LABORATORY PANEL:   CBC Recent Labs  Lab 05/09/18 1006  WBC 15.5*  HGB 13.4  HCT 38.3  PLT 284   ------------------------------------------------------------------------------------------------------------------  Chemistries  Recent Labs  Lab 05/09/18 1006  NA 139  K 3.3*  CL 107  CO2 23  GLUCOSE 119*  BUN 11  CREATININE 0.99  CALCIUM 9.5  AST 25  ALT 18  ALKPHOS 67  BILITOT 0.7   ------------------------------------------------------------------------------------------------------------------  Cardiac Enzymes Recent Labs   Lab 05/09/18 1006  TROPONINI <0.03   ------------------------------------------------------------------------------------------------------------------  RADIOLOGY:  Dg Chest 2 View  Result Date: 05/09/2018 CLINICAL DATA:  Right-sided chest pain and fever. EXAM: CHEST - 2 VIEW COMPARISON:  Chest x-ray and chest CT dated 03/16/2018 FINDINGS: The heart size and mediastinal contours are within normal limits. Both lungs are clear. The visualized skeletal structures are unremarkable. IMPRESSION: Normal exam. Electronically Signed   By: Lorriane Shire M.D.   On: 05/09/2018 10:45   Ct Chest W Contrast  Result Date: 05/09/2018 CLINICAL DATA:  Abdominal pain, low back pain, right breast pain, fever, and chills. EXAM: CT CHEST, ABDOMEN, AND PELVIS WITH CONTRAST TECHNIQUE: Multidetector CT imaging of the chest, abdomen and pelvis was performed following the standard protocol during bolus administration of intravenous contrast. CONTRAST:  10m ISOVUE-370 IOPAMIDOL (ISOVUE-370) INJECTION 76% COMPARISON:  Chest CT dated 03/16/2018 FINDINGS: CT CHEST FINDINGS Cardiovascular: No significant vascular findings. Normal heart size. No pericardial effusion. Mediastinum/Nodes: No enlarged mediastinal or hilar lymph nodes. Soft tissue stranding around a slightly prominent right axillary lymph node, unchanged. The node is not pathologically enlarged. Thyroid gland, trachea, and esophagus demonstrate no significant findings. Lungs/Pleura: Lungs are clear. No pleural effusion or pneumothorax. Musculoskeletal: No chest wall mass or suspicious bone lesions identified. CT ABDOMEN PELVIS FINDINGS Hepatobiliary: Hepatic steatosis. No focal liver lesions.  Cholecystectomy. No biliary ductal dilatation. Pancreas: Unremarkable. No pancreatic ductal dilatation or surrounding inflammatory changes. Spleen: Normal in size without focal abnormality. Adrenals/Urinary Tract: Adrenal glands are unremarkable. Kidneys are normal, without renal  calculi, focal lesion, or hydronephrosis. Bladder is unremarkable. Stomach/Bowel: Stomach is within normal limits. Appendix appears normal. No evidence of bowel wall thickening, distention, or inflammatory changes. Vascular/Lymphatic: No significant vascular findings are present. No enlarged abdominal or pelvic lymph nodes. Reproductive: Normal ovaries. 2.2 cm cyst in the region of the right cornu of the uterus. Other: No abdominal wall hernia or abnormality. No abdominopelvic ascites. Musculoskeletal: No acute or significant osseous findings. IMPRESSION: 1. Small inflamed right axillary lymph node, unchanged since the prior CT scan of 03/16/2018. Otherwise normal CT scan of the chest. 2. Hepatic steatosis with slight hepatomegaly. 3. 19 mm cyst in the region of the right cornua of the uterus. This could represent a degenerated fibroid or possibly a cornual hematometra. Has the patient had prior endometrial ablation? If not, ultrasound may be useful for further characterization. Electronically Signed   By: Lorriane Shire M.D.   On: 05/09/2018 12:21   Ct Abdomen Pelvis W Contrast  Result Date: 05/09/2018 CLINICAL DATA:  Abdominal pain, low back pain, right breast pain, fever, and chills. EXAM: CT CHEST, ABDOMEN, AND PELVIS WITH CONTRAST TECHNIQUE: Multidetector CT imaging of the chest, abdomen and pelvis was performed following the standard protocol during bolus administration of intravenous contrast. CONTRAST:  65m ISOVUE-370 IOPAMIDOL (ISOVUE-370) INJECTION 76% COMPARISON:  Chest CT dated 03/16/2018 FINDINGS: CT CHEST FINDINGS Cardiovascular: No significant vascular findings. Normal heart size. No pericardial effusion. Mediastinum/Nodes: No enlarged mediastinal or hilar lymph nodes. Soft tissue stranding around a slightly prominent right axillary lymph node, unchanged. The node is not pathologically enlarged. Thyroid gland, trachea, and esophagus demonstrate no significant findings. Lungs/Pleura: Lungs are  clear. No pleural effusion or pneumothorax. Musculoskeletal: No chest wall mass or suspicious bone lesions identified. CT ABDOMEN PELVIS FINDINGS Hepatobiliary: Hepatic steatosis. No focal liver lesions. Cholecystectomy. No biliary ductal dilatation. Pancreas: Unremarkable. No pancreatic ductal dilatation or surrounding inflammatory changes. Spleen: Normal in size without focal abnormality. Adrenals/Urinary Tract: Adrenal glands are unremarkable. Kidneys are normal, without renal calculi, focal lesion, or hydronephrosis. Bladder is unremarkable. Stomach/Bowel: Stomach is within normal limits. Appendix appears normal. No evidence of bowel wall thickening, distention, or inflammatory changes. Vascular/Lymphatic: No significant vascular findings are present. No enlarged abdominal or pelvic lymph nodes. Reproductive: Normal ovaries. 2.2 cm cyst in the region of the right cornu of the uterus. Other: No abdominal wall hernia or abnormality. No abdominopelvic ascites. Musculoskeletal: No acute or significant osseous findings. IMPRESSION: 1. Small inflamed right axillary lymph node, unchanged since the prior CT scan of 03/16/2018. Otherwise normal CT scan of the chest. 2. Hepatic steatosis with slight hepatomegaly. 3. 19 mm cyst in the region of the right cornua of the uterus. This could represent a degenerated fibroid or possibly a cornual hematometra. Has the patient had prior endometrial ablation? If not, ultrasound may be useful for further characterization. Electronically Signed   By: JLorriane ShireM.D.   On: 05/09/2018 12:21   [employer] Right [employer] Axilla  Result Date: 05/09/2018 CLINICAL DATA:  Right breast pain. EXAM: ULTRASOUND OF THE RIGHT BREAST COMPARISON:  None. FINDINGS: Static images from targeted right breast ultrasound, in the area of pain, 8 o'clock 8 cm from the nipple demonstrate no suspicious masses or fluid collections. Ultrasound examination of the right axilla demonstrates no evidence of  lymphadenopathy.  There is a superficial complex fluid collection with tract to the skin which measures 1.4 cm and likely represents a benign finding of dermal origin, such as a ruptured epidermal inclusion or sebaceous cyst. IMPRESSION: No sonographic correlation to the area of pain in patient's right breast at 8 o'clock. No breast abscess was seen. No evidence of right axillary lymphadenopathy. RECOMMENDATION: Clinical follow-up for patient's right breast pain. Bilateral mammogram is recommended, as soon as the patient is able to tolerate the compression necessary for the exam. I have discussed the findings and recommendations with the patient. Results were also provided in writing at the conclusion of the visit. If applicable, a reminder letter will be sent to the patient regarding the next appointment. BI-RADS CATEGORY  2: Benign. Electronically Signed   By: Fidela Salisbury M.D.   On: 05/09/2018 11:44     IMPRESSION AND PLAN:   42 year old female with past medical history of asthma who presents to the hospital due to generalized body aches, right breast pain.  Patient met sepsis criteria given her elevated lactic acid,'s fever, leukocytosis.  1.  Sepsis- patient meets criteria given her leukocytosis, fever, elevated lactic acid. - Source of her sepsis is unclear but suspected to be secondary to hidradenitis is seen on exam.  Patient's chest x-ray, urinalysis, CT chest abdomen and pelvis is really negative for any acute pathology. -We will empirically treated with vancomycin, clindamycin.  Patient is allergic to penicillin given her anaphylaxis.  Follow cultures.  2.  Hidradenitis-suspected source of patient's sepsis. -Continue antibiotics as mentioned above, will get surgical consult to see if she needs drainage.  3.  Asthma-no acute exacerbation.   -continue albuterol inhaler as needed.    All the records are reviewed and case discussed with ED provider. Management plans discussed with  the patient, family and they are in agreement.  CODE STATUS: Full code  TOTAL TIME TAKING CARE OF THIS PATIENT: 40 minutes.    Henreitta Leber M.D on 05/09/2018 at 1:25 PM  Between 7am to 6pm - Pager - [phone]  After 6pm go to [email or web address] - password [email or web address] [facility]  Office  [phone]  CC: Primary care physician; Patient, No Pcp Per

## 2018-05-09 NOTE — ED Triage Notes (Signed)
Pt states she woke up this am with fever, chills, lower back pain and right breast pain, states she has a cyst under her right arm that feels to be infected, draining puss yesterday.  General body aches as well. Denies any burning with urination or blood in urine.

## 2018-05-09 NOTE — ED Provider Notes (Signed)
[facility] Emergency Department Provider Note    First MD Initiated Contact with Patient 05/09/18 [phone]     (approximate)  I have reviewed the triage vital signs and the nursing notes.   HISTORY  Chief Complaint Chills; Back Pain; and Cyst    HPI Berdine Danceabitha Batton is a 42 y.o. female since the ER with chief complaint of dysuria right breast pain myalgias this started this morning.  States that she is been having progressive worsening pain and intermittent drainage from the right breast past several months.  Did reportedly have an abnormal mammogram 2 years ago but did not follow-up with this that she was supposed of had a biopsy.  States she has had adnexal abscesses that required drainage in the past and is noted draining from previous I&D site over the past few days.  Has had some nausea.  Some generalized abdominal pain.  Also having chest pain over the past month.    Past Medical History:  Diagnosis Date  . Asthma    FmHx:  Htn Past Surgical History:  Procedure Laterality Date  . CHOLECYSTECTOMY     There are no active problems to display for this patient.     Prior to Admission medications   Medication Sig Start Date End Date Taking? Authorizing Provider  azithromycin (ZITHROMAX Z-PAK) 250 MG tablet Take 2 tablets (500 mg) on  Day 1,  followed by 1 tablet (250 mg) once daily on Days 2 through 5. Patient not taking: Reported on 05/09/2018 12/25/17   Enid DerryWagner, Ashley, PA-C  benzonatate (TESSALON PERLES) 100 MG capsule Take 1 capsule (100 mg total) by mouth 3 (three) times daily as needed for cough. Patient not taking: Reported on 05/09/2018 12/25/17 12/25/18  Enid DerryWagner, Ashley, PA-C    Allergies Morphine and related; Penicillins; and Sulfa antibiotics    Social History Social History   Tobacco Use  . Smoking status: Never Smoker  . Smokeless tobacco: Never Used  Substance Use Topics  . Alcohol use: Not on file  . Drug use: Not on file    Review  of Systems Patient denies headaches, rhinorrhea, blurry vision, numbness, shortness of breath, chest pain, edema, cough, abdominal pain, nausea, vomiting, diarrhea, dysuria, fevers, rashes or hallucinations unless otherwise stated above in HPI. ____________________________________________   PHYSICAL EXAM:  VITAL SIGNS: Vitals:   05/09/18 0934  BP: 135/75  Pulse: (!) 115  Resp: 20  Temp: (!) 103.3 F (39.6 C)  SpO2: 99%    Constitutional: Alert and oriented. Ill appearing Eyes: Conjunctivae are normal.  Head: Atraumatic. Nose: No congestion/rhinnorhea. Mouth/Throat: Mucous membranes are moist.   Neck: No stridor. Painless ROM.  Cardiovascular: Normal rate, regular rhythm. Grossly normal heart sounds.  Good peripheral circulation. Respiratory: Normal respiratory effort.  No retractions. Lungs CTAB. Gastrointestinal: Soft and nontender. No distention. No abdominal bruits. No CVA tenderness. Genitourinary: deferred Musculoskeletal: No lower extremity tenderness nor edema.  No joint effusions. Neurologic:  Normal speech and language. No gross focal neurologic deficits are appreciated. No facial droop Skin:  Skin is warm, dry and intact. No rash noted.  Right adnexa with purulent drainage from 1cm area of fluctuance without surrounding erythma or crepitus,  TTP for right breast with fluctuant feeling mass at 8oclock and beneath areola Psychiatric: Mood and affect are normal. Speech and behavior are normal.  ____________________________________________   LABS (all labs ordered are listed, but only abnormal results are displayed)  Results for orders placed or performed during the hospital encounter of 05/09/18 (  from the past 24 hour(s))  Lactic acid, plasma     Status: Abnormal   Collection Time: 05/09/18 10:06 AM  Result Value Ref Range   Lactic Acid, Venous 2.3 (HH) 0.5 - 1.9 mmol/L  Comprehensive metabolic panel     Status: Abnormal   Collection Time: 05/09/18 10:06 AM  Result  Value Ref Range   Sodium 139 135 - 145 mmol/L   Potassium 3.3 (L) 3.5 - 5.1 mmol/L   Chloride 107 98 - 111 mmol/L   CO2 23 22 - 32 mmol/L   Glucose, Bld 119 (H) 70 - 99 mg/dL   BUN 11 6 - 20 mg/dL   Creatinine, Ser 0.860.99 0.44 - 1.00 mg/dL   Calcium 9.5 8.9 - 57.810.3 mg/dL   Total Protein 7.9 6.5 - 8.1 g/dL   Albumin 4.6 3.5 - 5.0 g/dL   AST 25 15 - 41 U/L   ALT 18 0 - 44 U/L   Alkaline Phosphatase 67 38 - 126 U/L   Total Bilirubin 0.7 0.3 - 1.2 mg/dL   GFR calc non Af Amer >60 >60 mL/min   GFR calc Af Amer >60 >60 mL/min   Anion gap 9 5 - 15  Troponin I     Status: None   Collection Time: 05/09/18 10:06 AM  Result Value Ref Range   Troponin I <0.03 <0.03 ng/mL  CBC WITH DIFFERENTIAL     Status: Abnormal   Collection Time: 05/09/18 10:06 AM  Result Value Ref Range   WBC 15.5 (H) 3.6 - 11.0 K/uL   RBC 4.39 3.80 - 5.20 MIL/uL   Hemoglobin 13.4 12.0 - 16.0 g/dL   HCT 46.938.3 62.935.0 - 52.847.0 %   MCV 87.3 80.0 - 100.0 fL   MCH 30.5 26.0 - 34.0 pg   MCHC 34.9 32.0 - 36.0 g/dL   RDW 41.314.0 24.411.5 - 01.014.5 %   Platelets 284 150 - 440 K/uL   Neutrophils Relative % 91 %   Neutro Abs 14.2 (H) 1.4 - 6.5 K/uL   Lymphocytes Relative 4 %   Lymphs Abs 0.5 (L) 1.0 - 3.6 K/uL   Monocytes Relative 4 %   Monocytes Absolute 0.7 0.2 - 0.9 K/uL   Eosinophils Relative 0 %   Eosinophils Absolute 0.0 0 - 0.7 K/uL   Basophils Relative 1 %   Basophils Absolute 0.1 0 - 0.1 K/uL  Urinalysis, Complete w Microscopic     Status: Abnormal   Collection Time: 05/09/18 10:06 AM  Result Value Ref Range   Color, Urine YELLOW (A) YELLOW   APPearance HAZY (A) CLEAR   Specific Gravity, Urine 1.015 1.005 - 1.030   pH 6.0 5.0 - 8.0   Glucose, UA NEGATIVE NEGATIVE mg/dL   Hgb urine dipstick SMALL (A) NEGATIVE   Bilirubin Urine NEGATIVE NEGATIVE   Ketones, ur NEGATIVE NEGATIVE mg/dL   Protein, ur NEGATIVE NEGATIVE mg/dL   Nitrite NEGATIVE NEGATIVE   Leukocytes, UA NEGATIVE NEGATIVE   RBC / HPF 0-5 0 - 5 RBC/hpf   WBC, UA  0-5 0 - 5 WBC/hpf   Bacteria, UA NONE SEEN NONE SEEN   Squamous Epithelial / LPF 6-10 0 - 5   Mucus PRESENT   Pregnancy, urine POC     Status: None   Collection Time: 05/09/18 10:11 AM  Result Value Ref Range   Preg Test, Ur NEGATIVE NEGATIVE   ____________________________________________  EKG My review and personal interpretation at Time: 10:03   Indication: chest pain  Rate: 115  Rhythm: sinus Axis: normal Other: inferolateral t wave inversions, unchanged from previous ____________________________________________  RADIOLOGY  I personally reviewed all radiographic images ordered to evaluate for the above acute complaints and reviewed radiology reports and findings.  These findings were personally discussed with the patient.  Please see medical record for radiology report.  ____________________________________________   PROCEDURES  Procedure(s) performed:  Procedures    Critical Care performed: no ____________________________________________   INITIAL IMPRESSION / ASSESSMENT AND PLAN / ED COURSE  Pertinent labs & imaging results that were available during my care of the patient were reviewed by me and considered in my medical decision making (see chart for details).   DDX: sepsis, mass, abscess, uti, pyelo, pna, bacteremia  Jayliana Valencia is a 42 y.o. who presents to the ED with symptoms as described above.  Patient febrile tachycardic and ill-appearing.  Blood was sent to the above differential.  The patient will be placed on continuous pulse oximetry and telemetry for monitoring.  Laboratory evaluation will be sent to evaluate for the above complaints.     Clinical Course as of May 10 1243  Mon May 09, 2018  1102 Patient does meet septic criteria therefore will continue with IV fluids as well as start IV antibiotics.  Urinalysis is clear.  She is receiving ultrasound of right breast and I am concerned for abscess cellulitis.  Will start antibiotic coverage for  cellulitis.   [PR]  1141 Discussed results of ultrasound of radiology.  No identifiable source cause asked patient's pain.  Based on her fever persistent pain will order CT imaging of the chest abdomen pelvis to evaluate for source of sepsis.   [PR]  1228 CT does not show any evidence of infectious source.  At this point given her presentation will   [PR]    Clinical Course User Index [PR] Willy Eddy, MD     As part of my medical decision making, I reviewed the following data within the electronic MEDICAL RECORD NUMBER Nursing notes reviewed and incorporated, Labs reviewed, notes from prior ED visits.   ____________________________________________   FINAL CLINICAL IMPRESSION(S) / ED DIAGNOSES  Final diagnoses:  Breast pain in female  Sepsis, due to unspecified organism (HCC)  Breast pain      NEW MEDICATIONS STARTED DURING THIS VISIT:  New Prescriptions   No medications on file     Note:  This document was prepared using Dragon voice recognition software and may include unintentional dictation errors.    Willy Eddy, MD 05/09/18 1245

## 2018-05-09 NOTE — Progress Notes (Signed)
Pharmacy Antibiotic Note  Meredith Harris is a 42 y.o. female admitted on 05/09/2018 with cellulitis and concern for abscess.  Pharmacy has been consulted for vancomycin dosing.  Plan: Vancomycin 1 gm IV x 1 in ED followed in approximately 6 hours (stacked dosing) by vancomycin 1 gm IV Q12H, predicted trough 16 mcg/ml. Pharmacy will continue to follow and adjust as needed to maintain trough 15 to 20 mcg/ml.   Vd 47 L, Ke 0.071 hr-1, T1/2 9.8 hr  Height: 5\' 4"  (162.6 cm) Weight: 191 lb (86.6 kg) IBW/kg (Calculated) : 54.7  Temp (24hrs), Avg:103.3 F (39.6 C), Min:103.3 F (39.6 C), Max:103.3 F (39.6 C)  Recent Labs  Lab 05/09/18 1006  WBC 15.5*  CREATININE 0.99  LATICACIDVEN 2.3*    Estimated Creatinine Clearance: 80.5 mL/min (by C-G formula based on SCr of 0.99 mg/dL).    Allergies  Allergen Reactions  . Morphine And Related Other (See Comments)    Restless legs  . Penicillins Hives    Has patient had a PCN reaction causing immediate rash, facial/tongue/throat swelling, SOB or lightheadedness with hypotension: Yes Has patient had a PCN reaction causing severe rash involving mucus membranes or skin necrosis: Unknown Has patient had a PCN reaction that required hospitalization: Unknown Has patient had a PCN reaction occurring within the last 10 years: Unknown If all of the above answers are "NO", then may proceed with Cephalosporin use.   . Sulfa Antibiotics Hives    Antimicrobials this admission:   Dose adjustments this admission:   Microbiology results:  BCx:   UCx:    Sputum:    MRSA PCR:   Thank you for allowing pharmacy to be a part of this patient's care.  Carola FrostNathan A Marijke Guadiana, Pharm.D., BCPS Clinical Pharmacist 05/09/2018 1:16 PM

## 2018-05-09 NOTE — Consult Note (Addendum)
Subjective:   CC: right hidradenitis  HPI:  Meredith Harris is a 42 y.o. female who was consulted by Hilda LiasVivek Sainani for evaluation of right axillary hidradenitis.  This has been a chronic issue for the patient and she has had an I&D before in the past.  She initially came into the emergency department for overall malaise and body aches.  Work-up included a picture of sepsis however no obvious source was noted through the initial work-up except for possible acute exacerbation of her hidradenitis.  Surgery was consulted for further management of this.  Patient states the overall pain in the right axilla has only slightly worsened and the discharge from the area is also not significant. She is complaining of extensive right breast pain stating that if you have warm to the touch.   Past Medical History:  has a past medical history of Asthma.  Past Surgical History:  has a past surgical history that includes Cholecystectomy.  Family History: family history includes Hypertension in her father and mother; Stroke in her mother.  Social History:  reports that she has never smoked. She has never used smokeless tobacco. She reports that she drinks alcohol. Her drug history is not on file.  Current Medications:  Medications Prior to Admission  Medication Sig Dispense Refill  . azithromycin (ZITHROMAX Z-PAK) 250 MG tablet Take 2 tablets (500 mg) on  Day 1,  followed by 1 tablet (250 mg) once daily on Days 2 through 5. (Patient not taking: Reported on 05/09/2018) 6 each 0  . benzonatate (TESSALON PERLES) 100 MG capsule Take 1 capsule (100 mg total) by mouth 3 (three) times daily as needed for cough. (Patient not taking: Reported on 05/09/2018) 30 capsule 0    Allergies:  Allergies  Allergen Reactions  . Morphine And Related Other (See Comments)    Restless legs  . Penicillins Hives    Has patient had a PCN reaction causing immediate rash, facial/tongue/throat swelling, SOB or lightheadedness with  hypotension: Yes Has patient had a PCN reaction causing severe rash involving mucus membranes or skin necrosis: Unknown Has patient had a PCN reaction that required hospitalization: Unknown Has patient had a PCN reaction occurring within the last 10 years: Unknown If all of the above answers are "NO", then may proceed with Cephalosporin use.   . Sulfa Antibiotics Hives    ROS:  A 15 point review of systems was performed and pertinent positives and negatives noted in HPI   Objective:     BP 109/76 (BP Location: Right Arm)   Pulse 93   Temp 98.9 F (37.2 C) (Oral)   Resp 19   Ht 5\' 4"  (1.626 m)   Wt 86.6 kg   LMP 04/28/2018 (Approximate)   SpO2 100%   BMI 32.79 kg/m   Constitutional :  alert, cooperative and appears stated age  Lymphatics/Throat:  no asymmetry, masses, or scars  Respiratory:  clear to auscultation bilaterally  Cardiovascular:  regular rate and rhythm  Gastrointestinal: soft, non-tender; bowel sounds normal; no masses,  no organomegaly.  Musculoskeletal: Steady gait and movement  Skin: Cool and moist.  Breast exam while chaperone present in the room showed increased erythema warmth and tenderness within her right breast.  Symptoms are generalized to the whole breasts and no focal point of induration or fluid collection palpable.  The symptoms are localized within the right breast only in the chest wall immediately beyond the are not involved.  No obvious tracking noted from her right axillary hidradenitis either  Psychiatric: Normal affect, non-agitated, not confused       LABS:  CMP Latest Ref Rng & Units 05/09/2018 03/16/2018  Glucose 70 - 99 mg/dL 811(B) 147(W)  BUN 6 - 20 mg/dL 11 13  Creatinine 2.95 - 1.00 mg/dL 6.21 3.08(M)  Sodium 578 - 145 mmol/L 139 139  Potassium 3.5 - 5.1 mmol/L 3.3(L) 3.3(L)  Chloride 98 - 111 mmol/L 107 104  CO2 22 - 32 mmol/L 23 24  Calcium 8.9 - 10.3 mg/dL 9.5 9.1  Total Protein 6.5 - 8.1 g/dL 7.9 -  Total Bilirubin 0.3 - 1.2  mg/dL 0.7 -  Alkaline Phos 38 - 126 U/L 67 -  AST 15 - 41 U/L 25 -  ALT 0 - 44 U/L 18 -   CBC Latest Ref Rng & Units 05/09/2018 03/16/2018  WBC 3.6 - 11.0 K/uL 15.5(H) 15.3(H)  Hemoglobin 12.0 - 16.0 g/dL 46.9 62.9  Hematocrit 52.8 - 47.0 % 38.3 38.0  Platelets 150 - 440 K/uL 284 359    RADS: CLINICAL DATA:  Abdominal pain, low back pain, right breast pain, fever, and chills.  EXAM: CT CHEST, ABDOMEN, AND PELVIS WITH CONTRAST  TECHNIQUE: Multidetector CT imaging of the chest, abdomen and pelvis was performed following the standard protocol during bolus administration of intravenous contrast.  CONTRAST:  75mL ISOVUE-370 IOPAMIDOL (ISOVUE-370) INJECTION 76%  COMPARISON:  Chest CT dated 03/16/2018  FINDINGS: CT CHEST FINDINGS  Cardiovascular: No significant vascular findings. Normal heart size. No pericardial effusion.  Mediastinum/Nodes: No enlarged mediastinal or hilar lymph nodes. Soft tissue stranding around a slightly prominent right axillary lymph node, unchanged. The node is not pathologically enlarged. Thyroid gland, trachea, and esophagus demonstrate no significant findings.  Lungs/Pleura: Lungs are clear. No pleural effusion or pneumothorax.  Musculoskeletal: No chest wall mass or suspicious bone lesions identified.  CT ABDOMEN PELVIS FINDINGS  Hepatobiliary: Hepatic steatosis. No focal liver lesions. Cholecystectomy. No biliary ductal dilatation.  Pancreas: Unremarkable. No pancreatic ductal dilatation or surrounding inflammatory changes.  Spleen: Normal in size without focal abnormality.  Adrenals/Urinary Tract: Adrenal glands are unremarkable. Kidneys are normal, without renal calculi, focal lesion, or hydronephrosis. Bladder is unremarkable.  Stomach/Bowel: Stomach is within normal limits. Appendix appears normal. No evidence of bowel wall thickening, distention, or inflammatory changes.  Vascular/Lymphatic: No significant  vascular findings are present. No enlarged abdominal or pelvic lymph nodes.  Reproductive: Normal ovaries. 2.2 cm cyst in the region of the right cornu of the uterus.  Other: No abdominal wall hernia or abnormality. No abdominopelvic ascites.  Musculoskeletal: No acute or significant osseous findings.  IMPRESSION: 1. Small inflamed right axillary lymph node, unchanged since the prior CT scan of 03/16/2018. Otherwise normal CT scan of the chest. 2. Hepatic steatosis with slight hepatomegaly. 3. 19 mm cyst in the region of the right cornua of the uterus. This could represent a degenerated fibroid or possibly a cornual hematometra. Has the patient had prior endometrial ablation? If not, ultrasound may be useful for further characterization.   Electronically Signed   By: Francene Boyers M.D.   On: 05/09/2018 12:21  CLINICAL DATA:  Abdominal pain, low back pain, right breast pain, fever, and chills.  EXAM: CT CHEST, ABDOMEN, AND PELVIS WITH CONTRAST  TECHNIQUE: Multidetector CT imaging of the chest, abdomen and pelvis was performed following the standard protocol during bolus administration of intravenous contrast.  CONTRAST:  75mL ISOVUE-370 IOPAMIDOL (ISOVUE-370) INJECTION 76%  COMPARISON:  Chest CT dated 03/16/2018  FINDINGS: CT CHEST FINDINGS  Cardiovascular: No significant  vascular findings. Normal heart size. No pericardial effusion.  Mediastinum/Nodes: No enlarged mediastinal or hilar lymph nodes. Soft tissue stranding around a slightly prominent right axillary lymph node, unchanged. The node is not pathologically enlarged. Thyroid gland, trachea, and esophagus demonstrate no significant findings.  Lungs/Pleura: Lungs are clear. No pleural effusion or pneumothorax.  Musculoskeletal: No chest wall mass or suspicious bone lesions identified.  CT ABDOMEN PELVIS FINDINGS  Hepatobiliary: Hepatic steatosis. No focal liver  lesions. Cholecystectomy. No biliary ductal dilatation.  Pancreas: Unremarkable. No pancreatic ductal dilatation or surrounding inflammatory changes.  Spleen: Normal in size without focal abnormality.  Adrenals/Urinary Tract: Adrenal glands are unremarkable. Kidneys are normal, without renal calculi, focal lesion, or hydronephrosis. Bladder is unremarkable.  Stomach/Bowel: Stomach is within normal limits. Appendix appears normal. No evidence of bowel wall thickening, distention, or inflammatory changes.  Vascular/Lymphatic: No significant vascular findings are present. No enlarged abdominal or pelvic lymph nodes.  Reproductive: Normal ovaries. 2.2 cm cyst in the region of the right cornu of the uterus.  Other: No abdominal wall hernia or abnormality. No abdominopelvic ascites.  Musculoskeletal: No acute or significant osseous findings.  IMPRESSION: 1. Small inflamed right axillary lymph node, unchanged since the prior CT scan of 03/16/2018. Otherwise normal CT scan of the chest. 2. Hepatic steatosis with slight hepatomegaly. 3. 19 mm cyst in the region of the right cornua of the uterus. This could represent a degenerated fibroid or possibly a cornual hematometra. Has the patient had prior endometrial ablation? If not, ultrasound may be useful for further characterization.   Electronically Signed   By: Francene BoyersJames  Maxwell M.D.   On: 05/09/2018 12:21   Assessment:     Right axillary hidradenitis chronic mild exacerbation per patient report which is also consistent with the physical exam.  She also has right breast cellulitis.  she also is currently septic with no obvious source of infection.   Plan:    Discussed incision and drainage at bedside of her right axillary hidradenitis. 1. Alternatives include continued observation.  Benefits include possible symptom relief, pathologic evaluation, improved cosmesis. Discussed the risk of surgery including recurrence,  chronic pain, post-op infxn, poor cosmesis, poor/delayed wound healing, and possible re-operation to address said risks.   The patient verbalized understanding and all questions were answered to the patient's satisfaction.  She states that due to a poor experience with the previous bedside I&D she would like to defer the procedure for now and to pursue IV antibiotic therapy initially.  Surgery service will continue to see her while in-house to monitor any change in her hidradenitis symptoms and to offer any possible surgical intervention if the patient wishes.

## 2018-05-10 LAB — BASIC METABOLIC PANEL
ANION GAP: 5 (ref 5–15)
BUN: 12 mg/dL (ref 6–20)
CO2: 24 mmol/L (ref 22–32)
CREATININE: 1.02 mg/dL — AB (ref 0.44–1.00)
Calcium: 8.1 mg/dL — ABNORMAL LOW (ref 8.9–10.3)
Chloride: 109 mmol/L (ref 98–111)
Glucose, Bld: 111 mg/dL — ABNORMAL HIGH (ref 70–99)
Potassium: 3.2 mmol/L — ABNORMAL LOW (ref 3.5–5.1)
Sodium: 138 mmol/L (ref 135–145)

## 2018-05-10 LAB — CBC
HCT: 32 % — ABNORMAL LOW (ref 35.0–47.0)
HEMOGLOBIN: 11.2 g/dL — AB (ref 12.0–16.0)
MCH: 30.2 pg (ref 26.0–34.0)
MCHC: 35 g/dL (ref 32.0–36.0)
MCV: 86.5 fL (ref 80.0–100.0)
PLATELETS: 241 10*3/uL (ref 150–440)
RBC: 3.69 MIL/uL — AB (ref 3.80–5.20)
RDW: 14.2 % (ref 11.5–14.5)
WBC: 17.9 10*3/uL — ABNORMAL HIGH (ref 3.6–11.0)

## 2018-05-10 MED ORDER — KETOROLAC TROMETHAMINE 30 MG/ML IJ SOLN
30.0000 mg | Freq: Four times a day (QID) | INTRAMUSCULAR | Status: DC | PRN
  Administered 2018-05-10: 30 mg via INTRAVENOUS
  Filled 2018-05-10: qty 1

## 2018-05-10 MED ORDER — POTASSIUM CHLORIDE CRYS ER 20 MEQ PO TBCR
40.0000 meq | EXTENDED_RELEASE_TABLET | Freq: Once | ORAL | Status: AC
  Administered 2018-05-10: 40 meq via ORAL
  Filled 2018-05-10: qty 2

## 2018-05-10 MED ORDER — FENTANYL CITRATE (PF) 100 MCG/2ML IJ SOLN
12.5000 ug | INTRAMUSCULAR | Status: DC | PRN
  Administered 2018-05-10: 12.5 ug via INTRAVENOUS
  Filled 2018-05-10: qty 2

## 2018-05-10 MED ORDER — KETOROLAC TROMETHAMINE 15 MG/ML IJ SOLN
15.0000 mg | Freq: Four times a day (QID) | INTRAMUSCULAR | Status: DC | PRN
  Administered 2018-05-11: 15 mg via INTRAVENOUS
  Filled 2018-05-10: qty 1

## 2018-05-10 MED ORDER — HYDROMORPHONE HCL 1 MG/ML IJ SOLN
0.5000 mg | INTRAMUSCULAR | Status: DC | PRN
  Administered 2018-05-10: 0.5 mg via INTRAVENOUS
  Filled 2018-05-10: qty 0.5

## 2018-05-10 NOTE — Progress Notes (Addendum)
[facility] [place] NAME: Meredith Harris    MR#:  [id number]  DATE OF BIRTH:  Aug 01, 1976  SUBJECTIVE:  CHIEF COMPLAINT: Is reporting right axillary and breast pain.  She had mammogram 3- 4 years ago which was normal.  Seen by surgery today No fever today  REVIEW OF SYSTEMS:  CONSTITUTIONAL: No fever, fatigue or weakness.  EYES: No blurred or double vision.  EARS, NOSE, AND THROAT: No tinnitus or ear pain.  RESPIRATORY: No cough, shortness of breath, wheezing or hemoptysis.  CARDIOVASCULAR: No chest pain, orthopnea, edema.  GASTROINTESTINAL: No nausea, vomiting, diarrhea or abdominal pain.  GENITOURINARY: No dysuria, hematuria.  ENDOCRINE: No polyuria, nocturia,  HEMATOLOGY: No anemia, easy bruising or bleeding SKIN: No rash or lesion. MUSCULOSKELETAL: No joint pain or arthritis.   NEUROLOGIC: No tingling, numbness, weakness.  PSYCHIATRY: No anxiety or depression.   DRUG ALLERGIES:   Allergies  Allergen Reactions  . Morphine And Related Other (See Comments)    Restless legs  . Penicillins Hives    Has patient had a PCN reaction causing immediate rash, facial/tongue/throat swelling, SOB or lightheadedness with hypotension: Yes Has patient had a PCN reaction causing severe rash involving mucus membranes or skin necrosis: Unknown Has patient had a PCN reaction that required hospitalization: Unknown Has patient had a PCN reaction occurring within the last 10 years: Unknown If all of the above answers are "NO", then may proceed with Cephalosporin use.   . Sulfa Antibiotics Hives    VITALS:  Blood pressure 104/66, pulse 67, temperature 99.1 F (37.3 C), temperature source Oral, resp. rate 18, height '5\' 4"'  (1.626 m), weight 86.6 kg, last menstrual period 04/28/2018, SpO2 99 %.  PHYSICAL EXAMINATION:  GENERAL:  42 y.o.-year-old patient lying in the bed with no acute distress.  EYES: Pupils equal, round, reactive to light and  accommodation. No scleral icterus. Extraocular muscles intact.  HEENT: Head atraumatic, normocephalic. Oropharynx and nasopharynx clear.  NECK:  Supple, no jugular venous distention. No thyroid enlargement, no tenderness.  LUNGS: Normal breath sounds bilaterally, no wheezing, rales,rhonchi or crepitation. No use of accessory muscles of respiration.  CARDIOVASCULAR: S1, S2 normal. No murmurs, rubs, or gallops.  ABDOMEN: Soft, nontender, nondistended. Bowel sounds present.  EXTREMITIES: No pedal edema, cyanosis, or clubbing.  NEUROLOGIC: Cranial nerves II through XII are intact. Muscle strength 5/5 in all extremities. Sensation intact. Gait not checked.  PSYCHIATRIC: The patient is alert and oriented x 3.  SKIN: Right axillary area with hidradenitis , tender, no drainage noticed today.  Right breast is tender to touch, no discharge from the nipple no retraction of the nipple, no rash noticed   LABORATORY PANEL:   CBC Recent Labs  Lab 05/10/18 0356  WBC 17.9*  HGB 11.2*  HCT 32.0*  PLT 241   ------------------------------------------------------------------------------------------------------------------  Chemistries  Recent Labs  Lab 05/09/18 1006 05/10/18 0356  NA 139 138  K 3.3* 3.2*  CL 107 109  CO2 23 24  GLUCOSE 119* 111*  BUN 11 12  CREATININE 0.99 1.02*  CALCIUM 9.5 8.1*  AST 25  --   ALT 18  --   ALKPHOS 67  --   BILITOT 0.7  --    ------------------------------------------------------------------------------------------------------------------  Cardiac Enzymes Recent Labs  Lab 05/09/18 1006  TROPONINI <0.03   ------------------------------------------------------------------------------------------------------------------  RADIOLOGY:  Dg Chest 2 View  Result Date: 05/09/2018 CLINICAL DATA:  Right-sided chest pain and fever. EXAM: CHEST - 2 VIEW COMPARISON:  Chest x-ray  and chest CT dated 03/16/2018 FINDINGS: The heart size and mediastinal contours are  within normal limits. Both lungs are clear. The visualized skeletal structures are unremarkable. IMPRESSION: Normal exam. Electronically Signed   By: Lorriane Shire M.D.   On: 05/09/2018 10:45   Ct Chest W Contrast  Result Date: 05/09/2018 CLINICAL DATA:  Abdominal pain, low back pain, right breast pain, fever, and chills. EXAM: CT CHEST, ABDOMEN, AND PELVIS WITH CONTRAST TECHNIQUE: Multidetector CT imaging of the chest, abdomen and pelvis was performed following the standard protocol during bolus administration of intravenous contrast. CONTRAST:  106m ISOVUE-370 IOPAMIDOL (ISOVUE-370) INJECTION 76% COMPARISON:  Chest CT dated 03/16/2018 FINDINGS: CT CHEST FINDINGS Cardiovascular: No significant vascular findings. Normal heart size. No pericardial effusion. Mediastinum/Nodes: No enlarged mediastinal or hilar lymph nodes. Soft tissue stranding around a slightly prominent right axillary lymph node, unchanged. The node is not pathologically enlarged. Thyroid gland, trachea, and esophagus demonstrate no significant findings. Lungs/Pleura: Lungs are clear. No pleural effusion or pneumothorax. Musculoskeletal: No chest wall mass or suspicious bone lesions identified. CT ABDOMEN PELVIS FINDINGS Hepatobiliary: Hepatic steatosis. No focal liver lesions. Cholecystectomy. No biliary ductal dilatation. Pancreas: Unremarkable. No pancreatic ductal dilatation or surrounding inflammatory changes. Spleen: Normal in size without focal abnormality. Adrenals/Urinary Tract: Adrenal glands are unremarkable. Kidneys are normal, without renal calculi, focal lesion, or hydronephrosis. Bladder is unremarkable. Stomach/Bowel: Stomach is within normal limits. Appendix appears normal. No evidence of bowel wall thickening, distention, or inflammatory changes. Vascular/Lymphatic: No significant vascular findings are present. No enlarged abdominal or pelvic lymph nodes. Reproductive: Normal ovaries. 2.2 cm cyst in the region of the right  cornu of the uterus. Other: No abdominal wall hernia or abnormality. No abdominopelvic ascites. Musculoskeletal: No acute or significant osseous findings. IMPRESSION: 1. Small inflamed right axillary lymph node, unchanged since the prior CT scan of 03/16/2018. Otherwise normal CT scan of the chest. 2. Hepatic steatosis with slight hepatomegaly. 3. 19 mm cyst in the region of the right cornua of the uterus. This could represent a degenerated fibroid or possibly a cornual hematometra. Has the patient had prior endometrial ablation? If not, ultrasound may be useful for further characterization. Electronically Signed   By: JLorriane ShireM.D.   On: 05/09/2018 12:21   Ct Abdomen Pelvis W Contrast  Result Date: 05/09/2018 CLINICAL DATA:  Abdominal pain, low back pain, right breast pain, fever, and chills. EXAM: CT CHEST, ABDOMEN, AND PELVIS WITH CONTRAST TECHNIQUE: Multidetector CT imaging of the chest, abdomen and pelvis was performed following the standard protocol during bolus administration of intravenous contrast. CONTRAST:  782mISOVUE-370 IOPAMIDOL (ISOVUE-370) INJECTION 76% COMPARISON:  Chest CT dated 03/16/2018 FINDINGS: CT CHEST FINDINGS Cardiovascular: No significant vascular findings. Normal heart size. No pericardial effusion. Mediastinum/Nodes: No enlarged mediastinal or hilar lymph nodes. Soft tissue stranding around a slightly prominent right axillary lymph node, unchanged. The node is not pathologically enlarged. Thyroid gland, trachea, and esophagus demonstrate no significant findings. Lungs/Pleura: Lungs are clear. No pleural effusion or pneumothorax. Musculoskeletal: No chest wall mass or suspicious bone lesions identified. CT ABDOMEN PELVIS FINDINGS Hepatobiliary: Hepatic steatosis. No focal liver lesions. Cholecystectomy. No biliary ductal dilatation. Pancreas: Unremarkable. No pancreatic ductal dilatation or surrounding inflammatory changes. Spleen: Normal in size without focal abnormality.  Adrenals/Urinary Tract: Adrenal glands are unremarkable. Kidneys are normal, without renal calculi, focal lesion, or hydronephrosis. Bladder is unremarkable. Stomach/Bowel: Stomach is within normal limits. Appendix appears normal. No evidence of bowel wall thickening, distention, or inflammatory changes. Vascular/Lymphatic: No significant vascular findings are  present. No enlarged abdominal or pelvic lymph nodes. Reproductive: Normal ovaries. 2.2 cm cyst in the region of the right cornu of the uterus. Other: No abdominal wall hernia or abnormality. No abdominopelvic ascites. Musculoskeletal: No acute or significant osseous findings. IMPRESSION: 1. Small inflamed right axillary lymph node, unchanged since the prior CT scan of 03/16/2018. Otherwise normal CT scan of the chest. 2. Hepatic steatosis with slight hepatomegaly. 3. 19 mm cyst in the region of the right cornua of the uterus. This could represent a degenerated fibroid or possibly a cornual hematometra. Has the patient had prior endometrial ablation? If not, ultrasound may be useful for further characterization. Electronically Signed   By: Lorriane Shire M.D.   On: 05/09/2018 12:21   US Breast Ltd Uni Right Inc Axilla  Result Date: 05/09/2018 CLINICAL DATA:  Right breast pain. EXAM: ULTRASOUND OF THE RIGHT BREAST COMPARISON:  None. FINDINGS: Static images from targeted right breast ultrasound, in the area of pain, 8 o'clock 8 cm from the nipple demonstrate no suspicious masses or fluid collections. Ultrasound examination of the right axilla demonstrates no evidence of lymphadenopathy. There is a superficial complex fluid collection with tract to the skin which measures 1.4 cm and likely represents a benign finding of dermal origin, such as a ruptured epidermal inclusion or sebaceous cyst. IMPRESSION: No sonographic correlation to the area of pain in patient's right breast at 8 o'clock. No breast abscess was seen. No evidence of right axillary  lymphadenopathy. RECOMMENDATION: Clinical follow-up for patient's right breast pain. Bilateral mammogram is recommended, as soon as the patient is able to tolerate the compression necessary for the exam. I have discussed the findings and recommendations with the patient. Results were also provided in writing at the conclusion of the visit. If applicable, a reminder letter will be sent to the patient regarding the next appointment. BI-RADS CATEGORY  2: Benign. Electronically Signed   By: Fidela Salisbury M.D.   On: 05/09/2018 11:44    EKG:   Orders placed or performed during the hospital encounter of 05/09/18  . ED EKG  . ED EKG  . EKG 12-Lead  . EKG 12-Lead    ASSESSMENT AND PLAN:    42 year old female with past medical history of asthma who presents to the hospital due to generalized body aches, right breast pain.  Patient met sepsis criteria given her elevated lactic acid,'s fever, leukocytosis.  1.  Sepsis- patient met criteria given her leukocytosis, fever, elevated lactic acid. - Source of her sepsis is unclear but suspected to be secondary to hidradenitis is seen on exam. -Lactic acid is trending down 2.3-1.6 -Still has leukocytosis WBC 15-17.9 -  Patient's chest x-ray, urinalysis, CT chest abdomen and pelvis is really negative for any acute pathology. -We will empirically treated with vancomycin, clindamycin.   -Patient is allergic to penicillin given her anaphylaxis.  -Blood cultures are negative so far  2.  Hidradenitis-suspected source of patient's sepsis. -Continue antibiotics as mentioned above -Seen and evaluated by surgery Dr. Lysle Pearl, patient is not considering IND at this time.  Plan is to continue IV antibiotics and monitor patient clinically   3.  Asthma-no acute exacerbation.   -continue albuterol inhaler as needed.  4.  Right breast tenderness Ultrasound is normal.  Outpatient mammogram is recommended  5.  Cyst in the uterus-outpatient follow-up with  GYN  All the records are reviewed and case discussed with Care Management/Social Workerr. Management plans discussed with the patient, family and they are in agreement.  CODE  STATUS: fc  TOTAL TIME TAKING CARE OF THIS PATIENT: 36 minutes.   POSSIBLE D/C IN 1-2DAYS, DEPENDING ON CLINICAL CONDITION.  Note: This dictation was prepared with Dragon dictation along with smaller phrase technology. Any transcriptional errors that result from this process are unintentional.   Nicholes Mango M.D on 05/10/2018 at 3:40 PM  Between 7am to 6pm - Pager - [phone] After 6pm go to [email or web address] - password [email or web address] [facility]  Office  [phone]  CC: Primary care physician; Patient, No Pcp Per

## 2018-05-10 NOTE — Progress Notes (Addendum)
Subjective:    Meredith Harris is a 42 y.o. female  Hospital stay day 1,   sepsis, possible right axillary hidradinitis, right breast cellulits  States she feels slightly better.  No major change in axilla or breast symptoms  ROS:  A 5 point review of systems was performed and pertinent positives and negatives noted in HPI.  Objective:      Temp:  [98.7 F (37.1 C)-103.3 F (39.6 C)] 99.1 F (37.3 C) (08/13 0511) Pulse Rate:  [62-115] 67 (08/13 0511) Resp:  [16-27] 18 (08/13 0511) BP: (100-135)/(60-76) 104/66 (08/13 0511) SpO2:  [99 %-100 %] 99 % (08/13 0511) Weight:  [86.6 kg] 86.6 kg (08/12 0935)     Height: 5\' 4"  (162.6 cm) Weight: 86.6 kg BMI (Calculated): 32.77   Intake/Output this shift:  Total I/O In: 1483.9 [I.V.:1083.9; IV Piggyback:400] Out: 1000 [Urine:1000]       Constitutional :  alert, cooperative, appears stated age and no distress  Respiratory:  clear to auscultation bilaterally  Cardiovascular:  regular rate and rhythm  Gastrointestinal: soft, non-tender; bowel sounds normal; no masses,  no organomegaly.   Skin: Cool and moist. Chaperone present for exam.  Right axilla with unchanged amount of drainage, slightly softening induration?, no increase in tenderness.  Right breast exam erythema unchanged from yesterday.  No obvious discharge but there is slightly more focal tenderness at the 5 o'clock position to the nipple areolar border  Psychiatric: Normal affect, non-agitated, not confused       LABS:  CMP Latest Ref Rng & Units 05/10/2018 05/09/2018 03/16/2018  Glucose 70 - 99 mg/dL 829(F111(H) 621(H119(H) 086(V104(H)  BUN 6 - 20 mg/dL 12 11 13   Creatinine 0.44 - 1.00 mg/dL 7.84(O1.02(H) 9.620.99 9.52(W1.03(H)  Sodium 135 - 145 mmol/L 138 139 139  Potassium 3.5 - 5.1 mmol/L 3.2(L) 3.3(L) 3.3(L)  Chloride 98 - 111 mmol/L 109 107 104  CO2 22 - 32 mmol/L 24 23 24   Calcium 8.9 - 10.3 mg/dL 8.1(L) 9.5 9.1  Total Protein 6.5 - 8.1 g/dL - 7.9 -  Total Bilirubin 0.3 - 1.2 mg/dL - 0.7 -   Alkaline Phos 38 - 126 U/L - 67 -  AST 15 - 41 U/L - 25 -  ALT 0 - 44 U/L - 18 -   CBC Latest Ref Rng & Units 05/10/2018 05/09/2018 03/16/2018  WBC 3.6 - 11.0 K/uL 17.9(H) 15.5(H) 15.3(H)  Hemoglobin 12.0 - 16.0 g/dL 11.2(L) 13.4 12.8  Hematocrit 35.0 - 47.0 % 32.0(L) 38.3 38.0  Platelets 150 - 440 K/uL 241 284 359    RADS: n/a Assessment:   sepsis, possible right axillary hidradinitis, right breast cellulits.  We again discussed the risk benefits alternatives to incision and drainage of the right axillary hidradenitis.  Patient still would like to continue watchful waiting at this time.    With the tenderness noted in the right breast that is potentially worse, will need to continue to monitor for new abscess formation, US from yesterday noted with no abscess, so will continue present management for couple more day prior to re-imaging.

## 2018-05-11 LAB — CBC
HCT: 30.7 % — ABNORMAL LOW (ref 35.0–47.0)
Hemoglobin: 10.4 g/dL — ABNORMAL LOW (ref 12.0–16.0)
MCH: 30 pg (ref 26.0–34.0)
MCHC: 34.1 g/dL (ref 32.0–36.0)
MCV: 88.2 fL (ref 80.0–100.0)
PLATELETS: 220 10*3/uL (ref 150–440)
RBC: 3.48 MIL/uL — ABNORMAL LOW (ref 3.80–5.20)
RDW: 14.4 % (ref 11.5–14.5)
WBC: 12.1 10*3/uL — AB (ref 3.6–11.0)

## 2018-05-11 LAB — BASIC METABOLIC PANEL
ANION GAP: 6 (ref 5–15)
BUN: 8 mg/dL (ref 6–20)
CALCIUM: 7.8 mg/dL — AB (ref 8.9–10.3)
CO2: 23 mmol/L (ref 22–32)
CREATININE: 0.97 mg/dL (ref 0.44–1.00)
Chloride: 111 mmol/L (ref 98–111)
GFR calc Af Amer: >60 mL/min (ref >60)
GLUCOSE: 97 mg/dL (ref 70–99)
Potassium: 3.7 mmol/L (ref 3.5–5.1)
Sodium: 140 mmol/L (ref 135–145)

## 2018-05-11 LAB — MAGNESIUM: Magnesium: 1.8 mg/dL (ref 1.7–2.4)

## 2018-05-11 LAB — HIV ANTIBODY (ROUTINE TESTING W REFLEX): HIV Screen 4th Generation wRfx: NONREACTIVE

## 2018-05-11 LAB — VANCOMYCIN, TROUGH: Vancomycin Tr: 8 ug/mL — ABNORMAL LOW (ref 15–20)

## 2018-05-11 MED ORDER — VANCOMYCIN HCL IN DEXTROSE 1-5 GM/200ML-% IV SOLN
1000.0000 mg | Freq: Three times a day (TID) | INTRAVENOUS | Status: DC
  Administered 2018-05-11 – 2018-05-12 (×4): 1000 mg via INTRAVENOUS
  Filled 2018-05-11 (×8): qty 200

## 2018-05-11 NOTE — Progress Notes (Addendum)
Subjective:    Meredith Harris is a 42 y.o. female  Hospital stay day 2,   sepsis, possible right axillary hidradinitis, right breast cellulits  States she continues to feel slightly better.  No major change in axilla or breast symptoms.  Eating, voiding, having BMs  ROS:  A 5 point review of systems was performed and pertinent positives and negatives noted in HPI.  Objective:      Temp:  [99.7 F (37.6 C)-101.2 F (38.4 C)] 100.6 F (38.1 C) (08/14 0347) Pulse Rate:  [85-103] 97 (08/14 0347) Resp:  [18] 18 (08/14 0347) BP: (137-157)/(76-92) 137/76 (08/14 0347) SpO2:  [100 %] 100 % (08/14 0347)     Height: 5\' 4"  (162.6 cm) Weight: 86.6 kg BMI (Calculated): 32.77   Intake/Output this shift:  Total I/O In: 952.3 [P.O.:120; I.V.:582.3; IV Piggyback:250] Out: 1100 [Urine:1100]       Constitutional :  alert, cooperative, appears stated age and no distress  Respiratory:  clear to auscultation bilaterally  Cardiovascular:  regular rate and rhythm  Gastrointestinal: soft, non-tender; bowel sounds normal; no masses,  no organomegaly.   Skin: Cool and moist. Chaperone present for exam.  Right axilla with unchanged amount of drainage, continued softening induration?, no increase in tenderness.  Right breast exam erythema improved on the medial aspect compared to yesterday.  Induration improving as well.  No obvious discharge but there is focal tenderness at the 5 o'clock position to the nipple areolar border improving as well.  Psychiatric: Normal affect, non-agitated, not confused       LABS:  CMP Latest Ref Rng & Units 05/11/2018 05/10/2018 05/09/2018  Glucose 70 - 99 mg/dL 97 161(W111(H) 960(A119(H)  BUN 6 - 20 mg/dL 8 12 11   Creatinine 0.44 - 1.00 mg/dL 5.400.97 9.81(X1.02(H) 9.140.99  Sodium 135 - 145 mmol/L 140 138 139  Potassium 3.5 - 5.1 mmol/L 3.7 3.2(L) 3.3(L)  Chloride 98 - 111 mmol/L 111 109 107  CO2 22 - 32 mmol/L 23 24 23   Calcium 8.9 - 10.3 mg/dL 7.8(L) 8.1(L) 9.5  Total Protein 6.5 - 8.1  g/dL - - 7.9  Total Bilirubin 0.3 - 1.2 mg/dL - - 0.7  Alkaline Phos 38 - 126 U/L - - 67  AST 15 - 41 U/L - - 25  ALT 0 - 44 U/L - - 18   CBC Latest Ref Rng & Units 05/11/2018 05/10/2018 05/09/2018  WBC 3.6 - 11.0 K/uL 12.1(H) 17.9(H) 15.5(H)  Hemoglobin 12.0 - 16.0 g/dL 10.4(L) 11.2(L) 13.4  Hematocrit 35.0 - 47.0 % 30.7(L) 32.0(L) 38.3  Platelets 150 - 440 K/uL 220 241 284    RADS: n/a Assessment:   sepsis, possible source of right axillary hidradinitis, right breast cellulits.  We again discussed the risk benefits alternatives to incision and drainage of the right axillary hidradenitis.  Patient still would like to continue watchful waiting at this time.    Clinical exam is improving now with decreasing wbc.  Recommended continue present management.

## 2018-05-11 NOTE — Progress Notes (Signed)
Per MD okay for pt to shower. 

## 2018-05-11 NOTE — Progress Notes (Signed)
Pharmacy Antibiotic Note  Meredith Harris is a 42 y.o. 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 Pharmacy has been consulted for vancomycin dosing. Her leukocytosis has improved since admission but remains somewhat elevated. Renal function has been stable. There was one dose in this series leading up to the level that was given somewhat late. Surgery has been consulted but condition is being managed with antibiotics only at this point.  Plan: vancomycin 1 gm IV Q8H, predicted concentrations at steady-state: 29.9/15.6 mcg/mL. Pharmacy will continue to follow and adjust as needed to maintain trough 15 to 20 mcg/ml.   Vd 47 L, Ke 0.093 hr-1, T1/2: 7.5 hr  Height: 5\' 4"  (162.6 cm) Weight: 191 lb (86.6 kg) IBW/kg (Calculated) : 54.7  Temp (24hrs), Avg:100.5 F (38.1 C), Min:99.7 F (37.6 C), Max:101.2 F (38.4 C)  Recent Labs  Lab 05/09/18 1006 05/09/18 1314 05/10/18 0356 05/11/18 0724  WBC 15.5*  --  17.9* 12.1*  CREATININE 0.99  --  1.02* 0.97  LATICACIDVEN 2.3* 1.6  --   --   VANCOTROUGH  --   --   --  8*    Estimated Creatinine Clearance: 82.2 mL/min (by C-G formula based on SCr of 0.97 mg/dL).    Allergies  Allergen Reactions  . Morphine And Related Other (See Comments)    Restless legs  . Penicillins Hives    Has patient had a PCN reaction causing immediate rash, facial/tongue/throat swelling, SOB or lightheadedness with hypotension: Yes Has patient had a PCN reaction causing severe rash involving mucus membranes or skin necrosis: Unknown Has patient had a PCN reaction that required hospitalization: Unknown Has patient had a PCN reaction occurring within the last 10 years: Unknown If all of the above answers are "NO", then may proceed with Cephalosporin use.   . Sulfa Antibiotics Hives    Antimicrobials this admission: Ceftriaxone 8/12 Vancomycin 8/12>> Clindamycin 8/12>>  Microbiology results:  BCx:  8/12 pending  Thank you for allowing pharmacy to be a part of this patient's care.  Lowella Bandyodney D Mykenzi Vanzile, Pharm.D Clinical Pharmacist 05/11/2018 8:00 AM

## 2018-05-11 NOTE — Progress Notes (Signed)
Meredith Harris at [place] NAME: Meredith Harris    MR#:  [id number]  DATE OF BIRTH:  05-20-1976  SUBJECTIVE:  CHIEF COMPLAINT: Low-grade fever today otherwise she is fine.  Right axillary and breast pain are improving   She had mammogram 3- 4 years ago which was normal.    REVIEW OF SYSTEMS:  CONSTITUTIONAL: No fever, fatigue or weakness.  EYES: No blurred or double vision.  EARS, NOSE, AND THROAT: No tinnitus or ear pain.  RESPIRATORY: No cough, shortness of breath, wheezing or hemoptysis.  CARDIOVASCULAR: No chest pain, orthopnea, edema.  GASTROINTESTINAL: No nausea, vomiting, diarrhea or abdominal pain.  GENITOURINARY: No dysuria, hematuria.  ENDOCRINE: No polyuria, nocturia,  HEMATOLOGY: No anemia, easy bruising or bleeding SKIN: No rash or lesion. MUSCULOSKELETAL: No joint pain or arthritis.   NEUROLOGIC: No tingling, numbness, weakness.  PSYCHIATRY: No anxiety or depression.   DRUG ALLERGIES:   Allergies  Allergen Reactions  . Morphine And Related Other (See Comments)    Restless legs  . Penicillins Hives    Has patient had a PCN reaction causing immediate rash, facial/tongue/throat swelling, SOB or lightheadedness with hypotension: Yes Has patient had a PCN reaction causing severe rash involving mucus membranes or skin necrosis: Unknown Has patient had a PCN reaction that required hospitalization: Unknown Has patient had a PCN reaction occurring within the last 10 years: Unknown If all of the above answers are "NO", then may proceed with Cephalosporin use.   . Sulfa Antibiotics Hives    VITALS:  Blood pressure (!) 145/81, pulse 65, temperature 98.4 F (36.9 C), temperature source Oral, resp. rate 18, height '5\' 4"'  (1.626 m), weight 86.6 kg, last menstrual period 04/28/2018, SpO2 100 %.  PHYSICAL EXAMINATION:  GENERAL:  42 y.o.-year-old patient lying in the bed with no acute distress.  EYES: Pupils equal, round, reactive  to light and accommodation. No scleral icterus. Extraocular muscles intact.  HEENT: Head atraumatic, normocephalic. Oropharynx and nasopharynx clear.  NECK:  Supple, no jugular venous distention. No thyroid enlargement, no tenderness.  LUNGS: Normal breath sounds bilaterally, no wheezing, rales,rhonchi or crepitation. No use of accessory muscles of respiration.  CARDIOVASCULAR: S1, S2 normal. No murmurs, rubs, or gallops.  ABDOMEN: Soft, nontender, nondistended. Bowel sounds present.  EXTREMITIES: No pedal edema, cyanosis, or clubbing.  NEUROLOGIC: Cranial nerves II through XII are intact. Muscle strength 5/5 in all extremities. Sensation intact. Gait not checked.  PSYCHIATRIC: The patient is alert and oriented x 3.  SKIN: Right axillary area with hidradenitis , tender, no drainage noticed today.  Right breast is tender to touch, no discharge from the nipple no retraction of the nipple, no rash noticed   LABORATORY PANEL:   CBC Recent Labs  Lab 05/11/18 0724  WBC 12.1*  HGB 10.4*  HCT 30.7*  PLT 220   ------------------------------------------------------------------------------------------------------------------  Chemistries  Recent Labs  Lab 05/09/18 1006  05/11/18 0724  NA 139   < > 140  K 3.3*   < > 3.7  CL 107   < > 111  CO2 23   < > 23  GLUCOSE 119*   < > 97  BUN 11   < > 8  CREATININE 0.99   < > 0.97  CALCIUM 9.5   < > 7.8*  MG  --   --  1.8  AST 25  --   --   ALT 18  --   --   ALKPHOS 67  --   --  BILITOT 0.7  --   --    < > = values in this interval not displayed.   ------------------------------------------------------------------------------------------------------------------  Cardiac Enzymes Recent Labs  Lab 05/09/18 1006  TROPONINI <0.03   ------------------------------------------------------------------------------------------------------------------  RADIOLOGY:  No results found.  EKG:   Orders placed or performed during the hospital  encounter of 05/09/18  . ED EKG  . ED EKG  . EKG 12-Lead  . EKG 12-Lead    ASSESSMENT AND PLAN:    42 year old female with past medical history of asthma who presents to the hospital due to generalized body aches, right breast pain.  Patient met sepsis criteria given her elevated lactic acid,'s fever, leukocytosis.  1.  Sepsis- patient met criteria given her leukocytosis, fever, elevated lactic acid. - Source of her sepsis is unclear but suspected to be secondary to hidradenitis is seen on exam. -Clinically improving -Lactic acid is trending down 2.3-1.6 -Still has leukocytosis WBC 15-17.9--12.1 -  Patient's chest x-ray, urinalysis, CT chest abdomen and pelvis is really negative for any acute pathology. -We will empirically treated with vancomycin, clindamycin.   -Patient is allergic to penicillin given her anaphylaxis.  -Blood cultures are negative so far  2.  Hidradenitis-suspected source of patient's sepsis. -Continue antibiotics as mentioned above -Seen and evaluated by surgery Dr. Lysle Pearl, patient is not considering IND at this time.  Plan is to continue IV antibiotics and monitor patient clinically   3.  Asthma-no acute exacerbation.   -continue albuterol inhaler as needed.  4.  Right breast tenderness Ultrasound is normal.  Outpatient mammogram is recommended ASAP  5.  Cyst in the uterus-outpatient follow-up with GYN  All the records are reviewed and case discussed with Care Management/Social Workerr. Management plans discussed with the patient, family and they are in agreement.  CODE STATUS: fc  TOTAL TIME TAKING CARE OF THIS PATIENT: 36 minutes.   POSSIBLE D/C IN 1-DAYS, DEPENDING ON CLINICAL CONDITION.  Note: This dictation was prepared with Dragon dictation along with smaller phrase technology. Any transcriptional errors that result from this process are unintentional.   Nicholes Mango M.D on 05/11/2018 at 2:29 PM  Between 7am to 6pm - Pager -  [phone] After 6pm go to [email or web address] - password [email or web address] [facility]  Office  [phone]  CC: Primary care physician; Patient, No Pcp Per

## 2018-05-12 LAB — CBC
HEMATOCRIT: 30.6 % — AB (ref 35.0–47.0)
Hemoglobin: 10.5 g/dL — ABNORMAL LOW (ref 12.0–16.0)
MCH: 30.2 pg (ref 26.0–34.0)
MCHC: 34.5 g/dL (ref 32.0–36.0)
MCV: 87.5 fL (ref 80.0–100.0)
PLATELETS: 232 10*3/uL (ref 150–440)
RBC: 3.49 MIL/uL — ABNORMAL LOW (ref 3.80–5.20)
RDW: 14.7 % — AB (ref 11.5–14.5)
WBC: 8 10*3/uL (ref 3.6–11.0)

## 2018-05-12 MED ORDER — IBUPROFEN 200 MG PO TABS: 400.0000 mg | ORAL_TABLET | Freq: Four times a day (QID) | ORAL | 0 refills | Status: DC | PRN

## 2018-05-12 MED ORDER — ACETAMINOPHEN 325 MG PO TABS: 650.0000 mg | ORAL_TABLET | Freq: Four times a day (QID) | ORAL | Status: DC | PRN

## 2018-05-12 MED ORDER — SACCHAROMYCES BOULARDII 250 MG PO CAPS: 250.0000 mg | ORAL_CAPSULE | Freq: Two times a day (BID) | ORAL | 0 refills | Status: DC

## 2018-05-12 MED ORDER — DOXYCYCLINE HYCLATE 100 MG PO TABS: 100.0000 mg | ORAL_TABLET | Freq: Two times a day (BID) | ORAL | Status: DC

## 2018-05-12 MED ORDER — DOXYCYCLINE HYCLATE 100 MG PO TABS: 100.0000 mg | ORAL_TABLET | Freq: Two times a day (BID) | ORAL | 0 refills | Status: DC

## 2018-05-12 NOTE — Discharge Summary (Signed)
[place] [facility] [place] NAME: Meredith Harris    MR#:  [id number]  DATE OF BIRTH:  05-17-1976  DATE OF ADMISSION:  05/09/2018 ADMITTING PHYSICIAN: Henreitta Leber, MD  DATE OF DISCHARGE: 05/12/18  PRIMARY CARE PHYSICIAN: Patient, No Pcp Per    ADMISSION DIAGNOSIS:  Breast pain [N64.4] Breast pain in female [N64.4] Sepsis, due to unspecified organism ([place]) [A41.9]  DISCHARGE DIAGNOSIS:  Active Problems:   Sepsis ([place])   SECONDARY DIAGNOSIS:   Past Medical History:  Diagnosis Date  . Asthma     HOSPITAL COURSE:   HPI  Meredith Harris  is a 42 y.o. female with a known history of asthma who presents to the hospital due to chills, fever, generalized body aches.  Patient says she was in her usual state of health when this morning patient developed significant body aches, chills and was noted to have a fever of 103.  Patient denies any upper respiratory symptoms of cough, congestion, nasal discharge or sore throat.  She also denies any GI symptoms of abdominal pain, nausea, vomiting.  She denies any diarrhea.  She presents to the ER and was noted to have sepsis criteria given her elevated lactic acid, leukocytosis fever of 103.  Her chest x-ray/CT chest abdomen pelvis is negative for acute pathology.  She does say that she has some pain on her right breast area and on examination her right axilla is suggestive of some mild hidradenitis with some drainage.  Hospitalist services were contacted for admission.  1. Sepsis- patient met criteria given her leukocytosis, fever, elevated lactic acid. - Source of her sepsis is unclear but suspected to be secondary to hidradenitis is seen on exam. -Clinically improving -Lactic acid is trending down 2.3-1.6 -Improved leukocytosis WBC 15-17.9--12.1--8 - Patient's chest x-ray, urinalysis, CT chest abdomen and pelvis is really negative for any acute pathology. - empirically treated with vancomycin,  clindamycin. Discharge patient with clindamycin.  -Patient is allergic to penicillin given her anaphylaxis.  -Blood cultures are negative so far  2. Hidradenitis-suspected source of patient's sepsis. -Continue antibiotics as mentioned above -Seen and evaluated by surgery Dr. Lysle Pearl, patient is not considering IND at this time.  Clinically improved with IV antibiotics and discharge patient home with p.o. doxycycline with Florastor for 2 weeks and further continuation versus discontinuation of the antibiotics per primary care physician's discretion.  Outpatient ID consult  3. Asthma-no acute exacerbation.  -continue albuterol inhaler as needed.  4.  Right breast tenderness Resolved Ultrasound is normal.  Outpatient mammogram is recommended ASAP  5.  Cyst in the uterus-outpatient follow-up with GYN  DISCHARGE CONDITIONS:   Stable  CONSULTS OBTAINED:  Treatment Team:  Benjamine Sprague, DO   PROCEDURES NONE   DRUG ALLERGIES:   Allergies  Allergen Reactions  . Morphine And Related Other (See Comments)    Restless legs  . Penicillins Hives    Has patient had a PCN reaction causing immediate rash, facial/tongue/throat swelling, SOB or lightheadedness with hypotension: Yes Has patient had a PCN reaction causing severe rash involving mucus membranes or skin necrosis: Unknown Has patient had a PCN reaction that required hospitalization: Unknown Has patient had a PCN reaction occurring within the last 10 years: Unknown If all of the above answers are "NO", then may proceed with Cephalosporin use.   . Sulfa Antibiotics Hives    DISCHARGE MEDICATIONS:   Allergies as of 05/12/2018      Reactions   Morphine And Related Other (  See Comments)   Restless legs   Penicillins Hives   Has patient had a PCN reaction causing immediate rash, facial/tongue/throat swelling, SOB or lightheadedness with hypotension: Yes Has patient had a PCN reaction causing severe rash involving mucus  membranes or skin necrosis: Unknown Has patient had a PCN reaction that required hospitalization: Unknown Has patient had a PCN reaction occurring within the last 10 years: Unknown If all of the above answers are "NO", then may proceed with Cephalosporin use.   Sulfa Antibiotics Hives      Medication List    STOP taking these medications   azithromycin 250 MG tablet Commonly known as:  ZITHROMAX   benzonatate 100 MG capsule Commonly known as:  TESSALON     TAKE these medications   acetaminophen 325 MG tablet Commonly known as:  TYLENOL Take 2 tablets (650 mg total) by mouth every 6 (six) hours as needed for mild pain (or Fever >/= 101).   doxycycline 100 MG tablet Commonly known as:  VIBRA-TABS Take 1 tablet (100 mg total) by mouth every 12 (twelve) hours.   ibuprofen 200 MG tablet Commonly known as:  ADVIL,MOTRIN Take 2 tablets (400 mg total) by mouth every 6 (six) hours as needed.   saccharomyces boulardii 250 MG capsule Commonly known as:  FLORASTOR Take 1 capsule (250 mg total) by mouth 2 (two) times daily.        DISCHARGE INSTRUCTIONS:   All of the primary care physician in 3 to 4 days Follow-up with infectious disease  10 days  DIET:  Regular diet  DISCHARGE CONDITION:  Stable  ACTIVITY:  Activity as tolerated  OXYGEN:  Home Oxygen: No.   Oxygen Delivery: room air  DISCHARGE LOCATION:  home   If you experience worsening of your admission symptoms, develop shortness of breath, life threatening emergency, suicidal or homicidal thoughts you must seek medical attention immediately by calling 911 or calling your MD immediately  if symptoms less severe.  You Must read complete instructions/literature along with all the possible adverse reactions/side effects for all the Medicines you take and that have been prescribed to you. Take any new Medicines after you have completely understood and accpet all the possible adverse reactions/side effects.   Please  note  You were cared for by a hospitalist during your hospital stay. If you have any questions about your discharge medications or the care you received while you were in the hospital after you are discharged, you can call the unit and asked to speak with the hospitalist on call if the hospitalist that took care of you is not available. Once you are discharged, your primary care physician will handle any further medical issues. Please note that NO REFILLS for any discharge medications will be authorized once you are discharged, as it is imperative that you return to your primary care physician (or establish a relationship with a primary care physician if you do not have one) for your aftercare needs so that they can reassess your need for medications and monitor your lab values.     Today  Chief Complaint  Patient presents with  . Chills  . Back Pain  . Cyst   Patient is doing much better.  Pain significantly improved.  Refusing narcotics  ROS:  CONSTITUTIONAL: Denies fevers, chills. Denies any fatigue, weakness.  EYES: Denies blurry vision, double vision, eye pain. EARS, NOSE, THROAT: Denies tinnitus, ear pain, hearing loss. RESPIRATORY: Denies cough, wheeze, shortness of breath.  CARDIOVASCULAR: Denies chest pain, palpitations,  edema.  GASTROINTESTINAL: Denies nausea, vomiting, diarrhea, abdominal pain. Denies bright red blood per rectum. GENITOURINARY: Denies dysuria, hematuria. ENDOCRINE: Denies nocturia or thyroid problems. HEMATOLOGIC AND LYMPHATIC: Denies easy bruising or bleeding. SKIN: Denies rash or lesion. MUSCULOSKELETAL: Denies pain in neck, back, shoulder, knees, hips or arthritic symptoms.  NEUROLOGIC: Denies paralysis, paresthesias.  PSYCHIATRIC: Denies anxiety or depressive symptoms.   VITAL SIGNS:  Blood pressure 137/73, pulse 67, temperature 98.8 F (37.1 C), temperature source Oral, resp. rate 16, height '5\' 4"'  (1.626 m), weight 86.6 kg, last menstrual period  04/28/2018, SpO2 98 %.  I/O:    Intake/Output Summary (Last 24 hours) at 05/12/2018 1535 Last data filed at 05/12/2018 1227 Gross per 24 hour  Intake 3566 ml  Output 1700 ml  Net 1866 ml    PHYSICAL EXAMINATION:  GENERAL:  42 y.o.-year-old patient lying in the bed with no acute distress.  EYES: Pupils equal, round, reactive to light and accommodation. No scleral icterus. Extraocular muscles intact.  HEENT: Head atraumatic, normocephalic. Oropharynx and nasopharynx clear.  NECK:  Supple, no jugular venous distention. No thyroid enlargement, no tenderness.  LUNGS: Normal breath sounds bilaterally, no wheezing, rales,rhonchi or crepitation. No use of accessory muscles of respiration.  CARDIOVASCULAR: S1, S2 normal. No murmurs, rubs, or gallops.  ABDOMEN: Soft, non-tender, non-distended. Bowel sounds present. No organomegaly or mass.  EXTREMITIES: No pedal edema, cyanosis, or clubbing.  NEUROLOGIC: Cranial nerves II through XII are intact. Muscle strength 5/5 in all extremities. Sensation intact. Gait not checked.  PSYCHIATRIC: The patient is alert and oriented x 3.  SKIN: Right axillary area with less edema and no tenderness no erythema   DATA REVIEW:   CBC Recent Labs  Lab 05/12/18 0544  WBC 8.0  HGB 10.5*  HCT 30.6*  PLT 232    Chemistries  Recent Labs  Lab 05/09/18 1006  05/11/18 0724  NA 139   < > 140  K 3.3*   < > 3.7  CL 107   < > 111  CO2 23   < > 23  GLUCOSE 119*   < > 97  BUN 11   < > 8  CREATININE 0.99   < > 0.97  CALCIUM 9.5   < > 7.8*  MG  --   --  1.8  AST 25  --   --   ALT 18  --   --   ALKPHOS 67  --   --   BILITOT 0.7  --   --    < > = values in this interval not displayed.    Cardiac Enzymes Recent Labs  Lab 05/09/18 1006  TROPONINI <0.03    Microbiology Results  Results for orders placed or performed during the hospital encounter of 05/09/18  Blood culture (routine x 2)     Status: None (Preliminary result)   Collection Time: 05/09/18  12:32 PM  Result Value Ref Range Status   Specimen Description BLOOD LEFT ANTECUBITAL  Final   Special Requests   Final    BOTTLES DRAWN AEROBIC AND ANAEROBIC Blood Culture adequate volume   Culture   Final    NO GROWTH 3 DAYS Performed at [facility], [address] [place], [place] [address]    Report Status PENDING  Incomplete  Blood culture (routine x 2)     Status: None (Preliminary result)   Collection Time: 05/09/18 12:33 PM  Result Value Ref Range Status   Specimen Description BLOOD RIGHT ANTECUBITAL  Final  Special Requests   Final    BOTTLES DRAWN AEROBIC AND ANAEROBIC Blood Culture adequate volume   Culture   Final    NO GROWTH 3 DAYS Performed at [facility], [address], [place], [place] [address]    Report Status PENDING  Incomplete    RADIOLOGY:  Dg Chest 2 View  Result Date: 05/09/2018 CLINICAL DATA:  Right-sided chest pain and fever. EXAM: CHEST - 2 VIEW COMPARISON:  Chest x-ray and chest CT dated 03/16/2018 FINDINGS: The heart size and mediastinal contours are within normal limits. Both lungs are clear. The visualized skeletal structures are unremarkable. IMPRESSION: Normal exam. Electronically Signed   By: Lorriane Shire M.D.   On: 05/09/2018 10:45   Ct Chest W Contrast  Result Date: 05/09/2018 CLINICAL DATA:  Abdominal pain, low back pain, right breast pain, fever, and chills. EXAM: CT CHEST, ABDOMEN, AND PELVIS WITH CONTRAST TECHNIQUE: Multidetector CT imaging of the chest, abdomen and pelvis was performed following the standard protocol during bolus administration of intravenous contrast. CONTRAST:  9m ISOVUE-370 IOPAMIDOL (ISOVUE-370) INJECTION 76% COMPARISON:  Chest CT dated 03/16/2018 FINDINGS: CT CHEST FINDINGS Cardiovascular: No significant vascular findings. Normal heart size. No pericardial effusion. Mediastinum/Nodes: No enlarged mediastinal or hilar lymph nodes. Soft tissue stranding around a slightly prominent right axillary  lymph node, unchanged. The node is not pathologically enlarged. Thyroid gland, trachea, and esophagus demonstrate no significant findings. Lungs/Pleura: Lungs are clear. No pleural effusion or pneumothorax. Musculoskeletal: No chest wall mass or suspicious bone lesions identified. CT ABDOMEN PELVIS FINDINGS Hepatobiliary: Hepatic steatosis. No focal liver lesions. Cholecystectomy. No biliary ductal dilatation. Pancreas: Unremarkable. No pancreatic ductal dilatation or surrounding inflammatory changes. Spleen: Normal in size without focal abnormality. Adrenals/Urinary Tract: Adrenal glands are unremarkable. Kidneys are normal, without renal calculi, focal lesion, or hydronephrosis. Bladder is unremarkable. Stomach/Bowel: Stomach is within normal limits. Appendix appears normal. No evidence of bowel wall thickening, distention, or inflammatory changes. Vascular/Lymphatic: No significant vascular findings are present. No enlarged abdominal or pelvic lymph nodes. Reproductive: Normal ovaries. 2.2 cm cyst in the region of the right cornu of the uterus. Other: No abdominal wall hernia or abnormality. No abdominopelvic ascites. Musculoskeletal: No acute or significant osseous findings. IMPRESSION: 1. Small inflamed right axillary lymph node, unchanged since the prior CT scan of 03/16/2018. Otherwise normal CT scan of the chest. 2. Hepatic steatosis with slight hepatomegaly. 3. 19 mm cyst in the region of the right cornua of the uterus. This could represent a degenerated fibroid or possibly a cornual hematometra. Has the patient had prior endometrial ablation? If not, ultrasound may be useful for further characterization. Electronically Signed   By: JLorriane ShireM.D.   On: 05/09/2018 12:21   Ct Abdomen Pelvis W Contrast  Result Date: 05/09/2018 CLINICAL DATA:  Abdominal pain, low back pain, right breast pain, fever, and chills. EXAM: CT CHEST, ABDOMEN, AND PELVIS WITH CONTRAST TECHNIQUE: Multidetector CT imaging of  the chest, abdomen and pelvis was performed following the standard protocol during bolus administration of intravenous contrast. CONTRAST:  77mISOVUE-370 IOPAMIDOL (ISOVUE-370) INJECTION 76% COMPARISON:  Chest CT dated 03/16/2018 FINDINGS: CT CHEST FINDINGS Cardiovascular: No significant vascular findings. Normal heart size. No pericardial effusion. Mediastinum/Nodes: No enlarged mediastinal or hilar lymph nodes. Soft tissue stranding around a slightly prominent right axillary lymph node, unchanged. The node is not pathologically enlarged. Thyroid gland, trachea, and esophagus demonstrate no significant findings. Lungs/Pleura: Lungs are clear. No pleural effusion or pneumothorax. Musculoskeletal: No chest wall mass or suspicious bone  lesions identified. CT ABDOMEN PELVIS FINDINGS Hepatobiliary: Hepatic steatosis. No focal liver lesions. Cholecystectomy. No biliary ductal dilatation. Pancreas: Unremarkable. No pancreatic ductal dilatation or surrounding inflammatory changes. Spleen: Normal in size without focal abnormality. Adrenals/Urinary Tract: Adrenal glands are unremarkable. Kidneys are normal, without renal calculi, focal lesion, or hydronephrosis. Bladder is unremarkable. Stomach/Bowel: Stomach is within normal limits. Appendix appears normal. No evidence of bowel wall thickening, distention, or inflammatory changes. Vascular/Lymphatic: No significant vascular findings are present. No enlarged abdominal or pelvic lymph nodes. Reproductive: Normal ovaries. 2.2 cm cyst in the region of the right cornu of the uterus. Other: No abdominal wall hernia or abnormality. No abdominopelvic ascites. Musculoskeletal: No acute or significant osseous findings. IMPRESSION: 1. Small inflamed right axillary lymph node, unchanged since the prior CT scan of 03/16/2018. Otherwise normal CT scan of the chest. 2. Hepatic steatosis with slight hepatomegaly. 3. 19 mm cyst in the region of the right cornua of the uterus. This could  represent a degenerated fibroid or possibly a cornual hematometra. Has the patient had prior endometrial ablation? If not, ultrasound may be useful for further characterization. Electronically Signed   By: Lorriane Shire M.D.   On: 05/09/2018 12:21   US Breast Ltd Uni Right Inc Axilla  Result Date: 05/09/2018 CLINICAL DATA:  Right breast pain. EXAM: ULTRASOUND OF THE RIGHT BREAST COMPARISON:  None. FINDINGS: Static images from targeted right breast ultrasound, in the area of pain, 8 o'clock 8 cm from the nipple demonstrate no suspicious masses or fluid collections. Ultrasound examination of the right axilla demonstrates no evidence of lymphadenopathy. There is a superficial complex fluid collection with tract to the skin which measures 1.4 cm and likely represents a benign finding of dermal origin, such as a ruptured epidermal inclusion or sebaceous cyst. IMPRESSION: No sonographic correlation to the area of pain in patient's right breast at 8 o'clock. No breast abscess was seen. No evidence of right axillary lymphadenopathy. RECOMMENDATION: Clinical follow-up for patient's right breast pain. Bilateral mammogram is recommended, as soon as the patient is able to tolerate the compression necessary for the exam. I have discussed the findings and recommendations with the patient. Results were also provided in writing at the conclusion of the visit. If applicable, a reminder letter will be sent to the patient regarding the next appointment. BI-RADS CATEGORY  2: Benign. Electronically Signed   By: Fidela Salisbury M.D.   On: 05/09/2018 11:44    EKG:   Orders placed or performed during the hospital encounter of 05/09/18  . ED EKG  . ED EKG  . EKG 12-Lead  . EKG 12-Lead      Management plans discussed with the patient, family and they are in agreement.  CODE STATUS:     Code Status Orders  (From admission, onward)         Start     Ordered   05/09/18 1408  Full code  Continuous     05/09/18  1407        Code Status History    This patient has a current code status but no historical code status.      TOTAL TIME TAKING CARE OF THIS PATIENT: 43  minutes.   Note: This dictation was prepared with Dragon dictation along with smaller phrase technology. Any transcriptional errors that result from this process are unintentional.   '@M[facility]' @  on 05/12/2018 at 3:35 PM  Between 7am to 6pm - Pager - [phone]  After 6pm go to [email or web address] - password [email or web address]  [facility]  Office  [phone]  CC: Primary care physician; Patient, No Pcp Per

## 2018-05-12 NOTE — Discharge Instructions (Signed)
complete the antibiotic course F/U primary care physician in 3 to 4 days Follow-up with infectious disease  10 days

## 2018-05-12 NOTE — Progress Notes (Addendum)
Subjective:    Meredith Harris is a 42 y.o. female  Hospital stay day 3,   sepsis, possible right axillary hidradinitis, right breast cellulits  States she continues to feel slightly better.  No major change in axilla or breast symptoms.  Eating, voiding, having BMs  ROS:  A 5 point review of systems was performed and pertinent positives and negatives noted in HPI.  Objective:      Temp:  [98.4 F (36.9 C)-98.9 F (37.2 C)] 98.8 F (37.1 C) (08/15 0526) Pulse Rate:  [65-74] 67 (08/15 0526) Resp:  [16-18] 16 (08/15 0526) BP: (127-147)/(73-96) 137/73 (08/15 0526) SpO2:  [98 %-100 %] 98 % (08/15 0526)     Height: 5\' 4"  (162.6 cm) Weight: 86.6 kg BMI (Calculated): 32.77   Intake/Output this shift:  No intake/output data recorded.       Constitutional :  alert, cooperative, appears stated age and no distress  Respiratory:  clear to auscultation bilaterally  Cardiovascular:  regular rate and rhythm  Gastrointestinal: soft, non-tender; bowel sounds normal; no masses,  no organomegaly.   Skin: Cool and moist. Chaperone present for exam.  Right axilla with unchanged amount of drainage, continued softening induration, no increase in tenderness.  Right breast exam erythema improved again compared to yesterday.  Induration improving as well.  No obvious discharge and decreasing tenderness.  Psychiatric: Normal affect, non-agitated, not confused       LABS:  CMP Latest Ref Rng & Units 05/11/2018 05/10/2018 05/09/2018  Glucose 70 - 99 mg/dL 97 161(W111(H) 960(A119(H)  BUN 6 - 20 mg/dL 8 12 11   Creatinine 0.44 - 1.00 mg/dL 5.400.97 9.81(X1.02(H) 9.140.99  Sodium 135 - 145 mmol/L 140 138 139  Potassium 3.5 - 5.1 mmol/L 3.7 3.2(L) 3.3(L)  Chloride 98 - 111 mmol/L 111 109 107  CO2 22 - 32 mmol/L 23 24 23   Calcium 8.9 - 10.3 mg/dL 7.8(L) 8.1(L) 9.5  Total Protein 6.5 - 8.1 g/dL - - 7.9  Total Bilirubin 0.3 - 1.2 mg/dL - - 0.7  Alkaline Phos 38 - 126 U/L - - 67  AST 15 - 41 U/L - - 25  ALT 0 - 44 U/L - - 18    CBC Latest Ref Rng & Units 05/12/2018 05/11/2018 05/10/2018  WBC 3.6 - 11.0 K/uL 8.0 12.1(H) 17.9(H)  Hemoglobin 12.0 - 16.0 g/dL 10.5(L) 10.4(L) 11.2(L)  Hematocrit 35.0 - 47.0 % 30.6(L) 30.7(L) 32.0(L)  Platelets 150 - 440 K/uL 232 220 241    RADS: n/a Assessment:   sepsis, possible source of right axillary hidradinitis, right breast cellulits. Wbc wnl, resolving.  No surgical intervention needed.  Recommend patient to f/u with dermatologist for outpt management of hidradinitis since there is high morbidity associated with surgical excision.  Complete antibiotic course per medical team.  No f/u with general surgery needed.  Will sign off.  Please call with any questions

## 2018-05-12 NOTE — Progress Notes (Signed)
Discharge instructions reviewed with patient including followup visits and new medications.  Understanding was verbalized and all questions were answered.  IV removed without complication; patient tolerated well.  Patient discharged home via wheelchair in stable condition escorted by volunteer staff.  

## 2018-05-14 LAB — CULTURE, BLOOD (ROUTINE X 2)
CULTURE: NO GROWTH
Culture: NO GROWTH
SPECIAL REQUESTS: ADEQUATE
SPECIAL REQUESTS: ADEQUATE

## 2018-05-31 DIAGNOSIS — L732 Hidradenitis suppurativa: Secondary | ICD-10-CM | POA: Insufficient documentation

## 2018-07-16 ENCOUNTER — Other Ambulatory Visit: Payer: Self-pay

## 2018-07-16 ENCOUNTER — Emergency Department
Admission: EM | Admit: 2018-07-16 | Discharge: 2018-07-16 | Disposition: A | Discharge status: Home / Self Care | Payer: PRIVATE HEALTH INSURANCE | Attending: Emergency Medicine | Admitting: Emergency Medicine

## 2018-07-16 ENCOUNTER — Emergency Department: Payer: PRIVATE HEALTH INSURANCE

## 2018-07-16 DIAGNOSIS — K2900 Acute gastritis without bleeding: Secondary | ICD-10-CM | POA: Diagnosis not present

## 2018-07-16 DIAGNOSIS — J45909 Unspecified asthma, uncomplicated: Secondary | ICD-10-CM | POA: Insufficient documentation

## 2018-07-16 DIAGNOSIS — R1013 Epigastric pain: Secondary | ICD-10-CM | POA: Insufficient documentation

## 2018-07-16 DIAGNOSIS — Z79899 Other long term (current) drug therapy: Secondary | ICD-10-CM | POA: Diagnosis not present

## 2018-07-16 LAB — URINALYSIS, COMPLETE (UACMP) WITH MICROSCOPIC
Bacteria, UA: NONE SEEN
Bilirubin Urine: NEGATIVE
GLUCOSE, UA: NEGATIVE mg/dL
HGB URINE DIPSTICK: NEGATIVE
Leukocytes, UA: NEGATIVE
NITRITE: NEGATIVE
PROTEIN: NEGATIVE mg/dL
RBC / HPF: NONE SEEN RBC/hpf (ref 0–5)
WBC, UA: NONE SEEN WBC/hpf (ref 0–5)
pH: 5.5 (ref 5.0–8.0)

## 2018-07-16 LAB — COMPREHENSIVE METABOLIC PANEL
ALBUMIN: 3.8 g/dL (ref 3.5–5.0)
ALK PHOS: 78 U/L (ref 38–126)
ALT: 21 U/L (ref 0–44)
ANION GAP: 6 (ref 5–15)
AST: 44 U/L — ABNORMAL HIGH (ref 15–41)
BILIRUBIN TOTAL: 0.3 mg/dL (ref 0.3–1.2)
BUN: 9 mg/dL (ref 6–20)
CALCIUM: 8.3 mg/dL — AB (ref 8.9–10.3)
CO2: 21 mmol/L — ABNORMAL LOW (ref 22–32)
Chloride: 111 mmol/L (ref 98–111)
Creatinine, Ser: 0.96 mg/dL (ref 0.44–1.00)
GFR calc Af Amer: >60 mL/min (ref >60)
GLUCOSE: 90 mg/dL (ref 70–99)
Potassium: 3.9 mmol/L (ref 3.5–5.1)
Sodium: 138 mmol/L (ref 135–145)
TOTAL PROTEIN: 6.9 g/dL (ref 6.5–8.1)

## 2018-07-16 LAB — CBC
HCT: 37.6 % (ref 36.0–46.0)
Hemoglobin: 12.7 g/dL (ref 12.0–15.0)
MCH: 29.6 pg (ref 26.0–34.0)
MCHC: 33.8 g/dL (ref 30.0–36.0)
MCV: 87.6 fL (ref 80.0–100.0)
PLATELETS: 323 10*3/uL (ref 150–400)
RBC: 4.29 MIL/uL (ref 3.87–5.11)
RDW: 13.9 % (ref 11.5–15.5)
WBC: 6.6 10*3/uL (ref 4.0–10.5)
nRBC: 0 % (ref 0.0–0.2)

## 2018-07-16 LAB — POC URINE PREG, ED: Preg Test, Ur: NEGATIVE

## 2018-07-16 LAB — LIPASE, BLOOD: Lipase: 41 U/L (ref 11–51)

## 2018-07-16 MED ORDER — IOPAMIDOL (ISOVUE-300) INJECTION 61%
30.0000 mL | Freq: Once | INTRAVENOUS | Status: AC | PRN
  Administered 2018-07-16: 30 mL via ORAL

## 2018-07-16 MED ORDER — SUCRALFATE 1 G PO TABS: 1.0000 g | ORAL_TABLET | Freq: Four times a day (QID) | ORAL | 0 refills | Status: DC

## 2018-07-16 MED ORDER — PANTOPRAZOLE SODIUM 40 MG PO TBEC: 40.0000 mg | DELAYED_RELEASE_TABLET | Freq: Every day | ORAL | 1 refills | Status: DC

## 2018-07-16 MED ORDER — GI COCKTAIL ~~LOC~~
ORAL | Status: AC
  Filled 2018-07-16: qty 30

## 2018-07-16 MED ORDER — GI COCKTAIL ~~LOC~~
30.0000 mL | Freq: Once | ORAL | Status: AC
  Administered 2018-07-16: 30 mL via ORAL

## 2018-07-16 MED ORDER — ONDANSETRON HCL 4 MG/2ML IJ SOLN
4.0000 mg | Freq: Once | INTRAMUSCULAR | Status: AC
Start: 2018-07-16 — End: 2018-07-16
  Administered 2018-07-16: 4 mg via INTRAVENOUS
  Filled 2018-07-16: qty 2

## 2018-07-16 MED ORDER — IOPAMIDOL (ISOVUE-300) INJECTION 61%
100.0000 mL | Freq: Once | INTRAVENOUS | Status: AC | PRN
  Administered 2018-07-16: 100 mL via INTRAVENOUS

## 2018-07-16 NOTE — ED Provider Notes (Signed)
[facility] Emergency Department Provider Note   ____________________________________________    I have reviewed the triage vital signs and the nursing notes.   HISTORY  Chief Complaint Abdominal Pain     HPI Meredith Harris is a 42 y.o. female who presents with complaints of abdominal pain.  Patient describes moderate epigastric and periumbilical abdominal pain which is been ongoing primarily over the last 24 hours.  She does report a history of acid reflux but has never had discomfort in this area before.  Reports a history of a cholecystectomy.  Has mild nausea, no vomiting.  Has had diarrhea over the last 2 days as well.  No fevers reported.  Has not taken anything for this   Past Medical History:  Diagnosis Date  . Asthma     Patient Active Problem List   Diagnosis Date Noted  . Sepsis (HCC) 05/09/2018    Past Surgical History:  Procedure Laterality Date  . CHOLECYSTECTOMY      Prior to Admission medications   Medication Sig Start Date End Date Taking? Authorizing Provider  acetaminophen (TYLENOL) 325 MG tablet Take 2 tablets (650 mg total) by mouth every 6 (six) hours as needed for mild pain (or Fever >/= 101). 05/12/18   Ramonita Lab, MD  doxycycline (VIBRA-TABS) 100 MG tablet Take 1 tablet (100 mg total) by mouth every 12 (twelve) hours. 05/12/18   Ramonita Lab, MD  ibuprofen (ADVIL) 200 MG tablet Take 2 tablets (400 mg total) by mouth every 6 (six) hours as needed. 05/12/18   Gouru, Deanna Artis, MD  pantoprazole (PROTONIX) 40 MG tablet Take 1 tablet (40 mg total) by mouth daily. 07/16/18 07/16/19  Jene Every, MD  saccharomyces boulardii (FLORASTOR) 250 MG capsule Take 1 capsule (250 mg total) by mouth 2 (two) times daily. 05/12/18   Gouru, Deanna Artis, MD  sucralfate (CARAFATE) 1 g tablet Take 1 tablet (1 g total) by mouth 4 (four) times daily for 15 days. 07/16/18 07/31/18  Jene Every, MD     Allergies Morphine and related; Penicillins; and  Sulfa antibiotics  Family History  Problem Relation Age of Onset  . Hypertension Mother   . Stroke Mother   . Hypertension Father     Social History Social History   Tobacco Use  . Smoking status: Never Smoker  . Smokeless tobacco: Never Used  Substance Use Topics  . Alcohol use: Yes    Comment: socially  . Drug use: Not on file    Review of Systems  Constitutional: No fever/chills Eyes: No visual changes.  ENT: No sore throat. Cardiovascular: Denies chest pain. Respiratory: Denies shortness of breath. Gastrointestinal: As above Genitourinary: Negative for dysuria. Musculoskeletal: Negative for back pain. Skin: Negative for rash. Neurological: Negative for headaches    ____________________________________________   PHYSICAL EXAM:  VITAL SIGNS: ED Triage Vitals  Enc Vitals Group     BP 07/16/18 1728 129/82     Pulse Rate 07/16/18 1728 76     Resp 07/16/18 1727 18     Temp 07/16/18 1727 98 F (36.7 C)     Temp Source 07/16/18 1727 Oral     SpO2 07/16/18 1728 100 %     Weight 07/16/18 1729 88.9 kg (196 lb)     Height 07/16/18 1729 1.651 m (5\' 5" )     Head Circumference --      Peak Flow --      Pain Score 07/16/18 1729 7     Pain Loc --  Pain Edu? --      Excl. in GC? --     Constitutional: Alert and oriented. Eyes: Conjunctivae are normal.   Nose: No congestion/rhinnorhea. Mouth/Throat: Mucous membranes are moist.    Cardiovascular: Normal rate, regular rhythm. Grossly normal heart sounds.  Good peripheral circulation. Respiratory: Normal respiratory effort.  No retractions. Lungs CTAB. Gastrointestinal: Mild tenderness over the epigastrium and periumbilically. No distention.  No CVA tenderness.  Musculoskeletal:.  Warm and well perfused Neurologic:  Normal speech and language. No gross focal neurologic deficits are appreciated.  Skin:  Skin is warm, dry and intact. No rash noted. Psychiatric: Mood and affect are normal. Speech and behavior  are normal.  ____________________________________________   LABS (all labs ordered are listed, but only abnormal results are displayed)  Labs Reviewed  COMPREHENSIVE METABOLIC PANEL - Abnormal; Notable for the following components:      Result Value   CO2 21 (*)    Calcium 8.3 (*)    AST 44 (*)    All other components within normal limits  URINALYSIS, COMPLETE (UACMP) WITH MICROSCOPIC - Abnormal; Notable for the following components:   Specific Gravity, Urine >1.030 (*)    Ketones, ur TRACE (*)    All other components within normal limits  LIPASE, BLOOD  CBC  POC URINE PREG, ED   ____________________________________________  EKG  ED ECG REPORT I, Jene Every, the attending physician, personally viewed and interpreted this ECG.  Date: 07/16/2018  Rhythm: normal sinus rhythm QRS Axis: normal Intervals: normal ST/T Wave abnormalities: normal Narrative Interpretation: no evidence of acute ischemia  ____________________________________________  RADIOLOGY  CT abdomen pelvis no acute distress ____________________________________________   PROCEDURES  Procedure(s) performed: No  Procedures   Critical Care performed: No ____________________________________________   INITIAL IMPRESSION / ASSESSMENT AND PLAN / ED COURSE  Pertinent labs & imaging results that were available during my care of the patient were reviewed by me and considered in my medical decision making (see chart for details).  Patient presents with primarily epigastric discomfort, suspicious for gastritis.  Given GI cocktail with no significant improvement however.  Lab work is overall unremarkable.  Lipase is normal.  Patient denies alcohol use. pending imaging for possible diverticulitis given periumbilical discomfort.  CT scan is quite reassuring.  Patient feels better after IV Zofran as well.  Discussed that this is likely gastritis, will start her on Protonix and Carafate, recommend follow-up  with GI if continued symptoms.  Return precautions discussed    ____________________________________________   FINAL CLINICAL IMPRESSION(S) / ED DIAGNOSES  Final diagnoses:  Epigastric pain  Acute gastritis without hemorrhage, unspecified gastritis type        Note:  This document was prepared using Dragon voice recognition software and may include unintentional dictation errors.    Jene Every, MD 07/16/18 2226

## 2018-07-16 NOTE — ED Notes (Signed)
Pt reports diarrhea x2 days; sudden severe abd pain while at work 10/10; 7/10 by the time pt got to ED lobby; now 4/10.

## 2018-07-16 NOTE — ED Notes (Signed)
Report from georgie and bill, rn.

## 2018-07-16 NOTE — ED Triage Notes (Addendum)
C/o central upper abd pain that began today. Denies N&V. States diarrhea x 2 days. Also c/o that she felt like she was going to pass out at work. A&O x4, ambulatory with steady gait. No distress noted. No gallbladder.

## 2018-07-16 NOTE — ED Notes (Signed)
Pt has finished po contrast.  

## 2018-07-17 LAB — POCT PREGNANCY, URINE: Preg Test, Ur: NEGATIVE

## 2018-11-14 ENCOUNTER — Other Ambulatory Visit: Payer: Self-pay

## 2018-11-14 ENCOUNTER — Encounter: Payer: Self-pay | Admitting: Emergency Medicine

## 2018-11-14 ENCOUNTER — Emergency Department
Admission: EM | Admit: 2018-11-14 | Discharge: 2018-11-14 | Disposition: A | Discharge status: Home / Self Care | Payer: PRIVATE HEALTH INSURANCE | Attending: Emergency Medicine | Admitting: Emergency Medicine

## 2018-11-14 DIAGNOSIS — Z79899 Other long term (current) drug therapy: Secondary | ICD-10-CM | POA: Insufficient documentation

## 2018-11-14 DIAGNOSIS — L732 Hidradenitis suppurativa: Secondary | ICD-10-CM | POA: Insufficient documentation

## 2018-11-14 DIAGNOSIS — L02411 Cutaneous abscess of right axilla: Secondary | ICD-10-CM | POA: Insufficient documentation

## 2018-11-14 DIAGNOSIS — J45909 Unspecified asthma, uncomplicated: Secondary | ICD-10-CM | POA: Insufficient documentation

## 2018-11-14 DIAGNOSIS — L02419 Cutaneous abscess of limb, unspecified: Secondary | ICD-10-CM

## 2018-11-14 MED ORDER — CLINDAMYCIN HCL 150 MG PO CAPS
300.0000 mg | ORAL_CAPSULE | Freq: Once | ORAL | Status: AC
  Administered 2018-11-14: 300 mg via ORAL
  Filled 2018-11-14: qty 2

## 2018-11-14 MED ORDER — CLINDAMYCIN HCL 300 MG PO CAPS: 300.0000 mg | ORAL_CAPSULE | Freq: Four times a day (QID) | ORAL | 0 refills | Status: DC

## 2018-11-14 NOTE — ED Triage Notes (Signed)
Pt c/o abscess to RT axilla x2days , denies fever or drainage. VSS . Swelling noted

## 2018-11-14 NOTE — ED Notes (Signed)
See triage note    States she developed a possible abscess area under right arm 2 days ago

## 2018-11-14 NOTE — ED Provider Notes (Signed)
[facility] Emergency Department Provider Note  ____________________________________________  Time seen: Approximately 7:53 PM  I have reviewed the triage vital signs and the nursing notes.   HISTORY  Chief Complaint No chief complaint on file.    HPI Meredith Thissen is a 43 y.o. female who presents the emergency department complaining of draining "abscess" to the right axilla.  Patient reports that she has a history of reoccurring lesions in the axillary regions.  Patient reports that approximately a year ago she became septic after an episode of cellulitis in the axilla.  Patient was reportedly supposed to follow-up with surgery but lost her insurance that she changed jobs.  Patient reports that she has insurance now and is willing to follow-up.  Patient reports that she has been using Epsom salts and warm compresses to bring the area to a "hat" and it is now draining purulent material.    Past Medical History:  Diagnosis Date  . Asthma     Patient Active Problem List   Diagnosis Date Noted  . Sepsis (HCC) 05/09/2018    Past Surgical History:  Procedure Laterality Date  . CHOLECYSTECTOMY      Prior to Admission medications   Medication Sig Start Date End Date Taking? Authorizing Provider  acetaminophen (TYLENOL) 325 MG tablet Take 2 tablets (650 mg total) by mouth every 6 (six) hours as needed for mild pain (or Fever >/= 101). 05/12/18   Ramonita Lab, MD  clindamycin (CLEOCIN) 300 MG capsule Take 1 capsule (300 mg total) by mouth 4 (four) times daily. 11/14/18   Cuthriell, Delorise Royals, PA-C  doxycycline (VIBRA-TABS) 100 MG tablet Take 1 tablet (100 mg total) by mouth every 12 (twelve) hours. 05/12/18   Ramonita Lab, MD  ibuprofen (ADVIL) 200 MG tablet Take 2 tablets (400 mg total) by mouth every 6 (six) hours as needed. 05/12/18   Gouru, Deanna Artis, MD  pantoprazole (PROTONIX) 40 MG tablet Take 1 tablet (40 mg total) by mouth daily. 07/16/18 07/16/19  Jene Every, MD  saccharomyces boulardii (FLORASTOR) 250 MG capsule Take 1 capsule (250 mg total) by mouth 2 (two) times daily. 05/12/18   Gouru, Deanna Artis, MD  sucralfate (CARAFATE) 1 g tablet Take 1 tablet (1 g total) by mouth 4 (four) times daily for 15 days. 07/16/18 07/31/18  Jene Every, MD    Allergies Morphine and related; Penicillins; and Sulfa antibiotics  Family History  Problem Relation Age of Onset  . Hypertension Mother   . Stroke Mother   . Hypertension Father     Social History Social History   Tobacco Use  . Smoking status: Never Smoker  . Smokeless tobacco: Never Used  Substance Use Topics  . Alcohol use: Yes    Comment: socially  . Drug use: Not on file     Review of Systems  Constitutional: No fever/chills Eyes: No visual changes. No discharge ENT: No upper respiratory complaints. Cardiovascular: no chest pain. Respiratory: no cough. No SOB. Gastrointestinal: No abdominal pain.  No nausea, no vomiting.   Musculoskeletal: Negative for musculoskeletal pain. Skin: Positive for draining lesion in the right axilla Neurological: Negative for headaches, focal weakness or numbness. 10-point ROS otherwise negative.  ____________________________________________   PHYSICAL EXAM:  VITAL SIGNS: ED Triage Vitals [11/14/18 1726]  Enc Vitals Group     BP (!) 153/87     Pulse Rate 81     Resp 16     Temp 98.7 F (37.1 C)     Temp Source Oral  SpO2 100 %     Weight      Height      Head Circumference      Peak Flow      Pain Score 10     Pain Loc      Pain Edu?      Excl. in GC?      Constitutional: Alert and oriented. Well appearing and in no acute distress. Eyes: Conjunctivae are normal. PERRL. EOMI. Head: Atraumatic. Neck: No stridor.  No cervical spine tenderness to palpation. Hematological/Lymphatic/Immunilogical: No cervical lymphadenopathy. Cardiovascular: Normal rate, regular rhythm. Normal S1 and S2.  Good peripheral  circulation. Respiratory: Normal respiratory effort without tachypnea or retractions. Lungs CTAB. Good air entry to the bases with no decreased or absent breath sounds. Musculoskeletal: Full range of motion to all extremities. No gross deformities appreciated. Neurologic:  Normal speech and language. No gross focal neurologic deficits are appreciated.  Skin:  Skin is warm, dry and intact. No rash noted.  Visualization of the right axilla reveals erythematous lesion that is spontaneously draining purulent material to the right axilla.  Previous scar tissue in region consistent with hidradenitis supportive.  No gross cellulitic changes other than lesion.  Lesion measures proximately 3 cm in diameter.  No regional lymphadenopathy.  No streaking. Psychiatric: Mood and affect are normal. Speech and behavior are normal. Patient exhibits appropriate insight and judgement.   ____________________________________________   LABS (all labs ordered are listed, but only abnormal results are displayed)  Labs Reviewed - No data to display ____________________________________________  EKG   ____________________________________________  RADIOLOGY   No results found.  ____________________________________________    PROCEDURES  Procedure(s) performed:    Procedures    Medications - No data to display   ____________________________________________   INITIAL IMPRESSION / ASSESSMENT AND PLAN / ED COURSE  Pertinent labs & imaging results that were available during my care of the patient were reviewed by me and considered in my medical decision making (see chart for details).  Review of the [facility] CSRS was performed in accordance of the [facility] prior to dispensing any controlled drugs.      Patient's diagnosis is consistent with axillary abscess/hidradenitis supportive a.  Patient presents emergency department with a history of recurring abscesses to the axilla.  Patient does have a draining  abscess to the right axilla.  On exam, area is already draining does not need incision and drainage.  Patient will be placed on antibiotics for same.  She is to follow-up with surgery for hidradenitis supportive..  No indication for labs or imaging at this time.  Patient is given ED precautions to return to the ED for any worsening or new symptoms.     ____________________________________________  FINAL CLINICAL IMPRESSION(S) / ED DIAGNOSES  Final diagnoses:  Axillary abscess  Hidradenitis suppurativa      NEW MEDICATIONS STARTED DURING THIS VISIT:  ED Discharge Orders         Ordered    clindamycin (CLEOCIN) 300 MG capsule  4 times daily     11/14/18 2005              This chart was dictated using voice recognition software/Dragon. Despite best efforts to proofread, errors can occur which can change the meaning. Any change was purely unintentional.   Racheal Patches, PA-C 11/14/18 2005    Jeanmarie Plant, MD 11/14/18 [phone]

## 2018-11-21 ENCOUNTER — Encounter: Payer: Self-pay | Admitting: *Deleted

## 2018-12-08 ENCOUNTER — Emergency Department
Admission: EM | Admit: 2018-12-08 | Discharge: 2018-12-08 | Disposition: A | Discharge status: Home / Self Care | Payer: Medicaid Other | Attending: Emergency Medicine | Admitting: Emergency Medicine

## 2018-12-08 ENCOUNTER — Emergency Department: Payer: Medicaid Other

## 2018-12-08 ENCOUNTER — Other Ambulatory Visit: Payer: Self-pay

## 2018-12-08 ENCOUNTER — Encounter: Payer: Self-pay | Admitting: Emergency Medicine

## 2018-12-08 DIAGNOSIS — Z9049 Acquired absence of other specified parts of digestive tract: Secondary | ICD-10-CM | POA: Insufficient documentation

## 2018-12-08 DIAGNOSIS — J069 Acute upper respiratory infection, unspecified: Secondary | ICD-10-CM | POA: Insufficient documentation

## 2018-12-08 DIAGNOSIS — J45909 Unspecified asthma, uncomplicated: Secondary | ICD-10-CM | POA: Insufficient documentation

## 2018-12-08 DIAGNOSIS — Z79899 Other long term (current) drug therapy: Secondary | ICD-10-CM | POA: Insufficient documentation

## 2018-12-08 LAB — CBC WITH DIFFERENTIAL/PLATELET
Abs Immature Granulocytes: 0.01 10*3/uL (ref 0.00–0.07)
BASOS PCT: 1 %
Basophils Absolute: 0 10*3/uL (ref 0.0–0.1)
EOS ABS: 0.2 10*3/uL (ref 0.0–0.5)
EOS PCT: 4 %
HEMATOCRIT: 35.9 % — AB (ref 36.0–46.0)
Hemoglobin: 12.3 g/dL (ref 12.0–15.0)
IMMATURE GRANULOCYTES: 0 %
Lymphocytes Relative: 43 %
Lymphs Abs: 2.4 10*3/uL (ref 0.7–4.0)
MCH: 29.6 pg (ref 26.0–34.0)
MCHC: 34.3 g/dL (ref 30.0–36.0)
MCV: 86.5 fL (ref 80.0–100.0)
MONO ABS: 0.3 10*3/uL (ref 0.1–1.0)
Monocytes Relative: 5 %
NEUTROS PCT: 47 %
Neutro Abs: 2.5 10*3/uL (ref 1.7–7.7)
PLATELETS: 286 10*3/uL (ref 150–400)
RBC: 4.15 MIL/uL (ref 3.87–5.11)
RDW: 13.2 % (ref 11.5–15.5)
WBC: 5.4 10*3/uL (ref 4.0–10.5)
nRBC: 0 % (ref 0.0–0.2)

## 2018-12-08 LAB — TROPONIN I

## 2018-12-08 LAB — COMPREHENSIVE METABOLIC PANEL
ALT: 16 U/L (ref 0–44)
AST: 22 U/L (ref 15–41)
Albumin: 4 g/dL (ref 3.5–5.0)
Alkaline Phosphatase: 59 U/L (ref 38–126)
Anion gap: 9 (ref 5–15)
BUN: 14 mg/dL (ref 6–20)
CO2: 21 mmol/L — ABNORMAL LOW (ref 22–32)
Calcium: 8.9 mg/dL (ref 8.9–10.3)
Chloride: 110 mmol/L (ref 98–111)
Creatinine, Ser: 0.8 mg/dL (ref 0.44–1.00)
GFR calc Af Amer: >60 mL/min (ref >60)
Glucose, Bld: 138 mg/dL — ABNORMAL HIGH (ref 70–99)
POTASSIUM: 3.7 mmol/L (ref 3.5–5.1)
Sodium: 140 mmol/L (ref 135–145)
Total Bilirubin: 0.6 mg/dL (ref 0.3–1.2)
Total Protein: 7.1 g/dL (ref 6.5–8.1)

## 2018-12-08 MED ORDER — IPRATROPIUM-ALBUTEROL 0.5-2.5 (3) MG/3ML IN SOLN
3.0000 mL | Freq: Once | RESPIRATORY_TRACT | Status: AC
  Administered 2018-12-08: 3 mL via RESPIRATORY_TRACT
  Filled 2018-12-08: qty 3

## 2018-12-08 MED ORDER — PREDNISONE 10 MG (21) PO TBPK: ORAL_TABLET | ORAL | 0 refills | Status: DC

## 2018-12-08 MED ORDER — PREDNISONE 20 MG PO TABS
60.0000 mg | ORAL_TABLET | Freq: Once | ORAL | Status: AC
  Administered 2018-12-08: 60 mg via ORAL
  Filled 2018-12-08: qty 3

## 2018-12-08 MED ORDER — ALBUTEROL SULFATE HFA 108 (90 BASE) MCG/ACT IN AERS: 2.0000 | INHALATION_SPRAY | Freq: Four times a day (QID) | RESPIRATORY_TRACT | 0 refills | Status: DC | PRN

## 2018-12-08 NOTE — Discharge Instructions (Addendum)
Please seek medical attention for any high fevers, chest pain, shortness of breath, change in behavior, persistent vomiting, bloody stool or any other new or concerning symptoms.  

## 2018-12-08 NOTE — ED Notes (Signed)
ED Provider at bedside. 

## 2018-12-08 NOTE — ED Triage Notes (Signed)
Patient ambulatory to triage with steady gait, without difficulty or distress noted; pt reports yesterday noted some nausea; awoke this morning with some SHOB and upper chest tightness; st hx of same with URI

## 2018-12-08 NOTE — ED Provider Notes (Signed)
[facility] Emergency Department Provider Note   ____________________________________________   I have reviewed the triage vital signs and the nursing notes.   HISTORY  Chief Complaint Shortness of breath  History limited by: Not Limited   HPI Meredith Harris is a 43 y.o. female who presents to the emergency department today with primary complaint of shortness of breath.  Patient states that it started today.  She was driving when the shortness of breath started.  It has been constant since it started.  This has been associated with some change discomfort.  She describes it as a tightness going across the top of her chest and into her back.  Patient denies any fevers.  States she has had similar symptoms when she has had upper respiratory infections in the past.  States she has a history of asthma but currently does not have any breathing treatments.   Per medical record review patient has a history of asthma  Past Medical History:  Diagnosis Date  . Asthma     Patient Active Problem List   Diagnosis Date Noted  . Sepsis (HCC) 05/09/2018    Past Surgical History:  Procedure Laterality Date  . CHOLECYSTECTOMY      Prior to Admission medications   Medication Sig Start Date End Date Taking? Authorizing Provider  acetaminophen (TYLENOL) 325 MG tablet Take 2 tablets (650 mg total) by mouth every 6 (six) hours as needed for mild pain (or Fever >/= 101). 05/12/18   Ramonita Lab, MD  clindamycin (CLEOCIN) 300 MG capsule Take 1 capsule (300 mg total) by mouth 4 (four) times daily. 11/14/18   Cuthriell, Delorise Royals, PA-C  doxycycline (VIBRA-TABS) 100 MG tablet Take 1 tablet (100 mg total) by mouth every 12 (twelve) hours. 05/12/18   Ramonita Lab, MD  ibuprofen (ADVIL) 200 MG tablet Take 2 tablets (400 mg total) by mouth every 6 (six) hours as needed. 05/12/18   Gouru, Deanna Artis, MD  pantoprazole (PROTONIX) 40 MG tablet Take 1 tablet (40 mg total) by mouth daily. 07/16/18  07/16/19  Jene Every, MD  saccharomyces boulardii (FLORASTOR) 250 MG capsule Take 1 capsule (250 mg total) by mouth 2 (two) times daily. 05/12/18   Gouru, Deanna Artis, MD  sucralfate (CARAFATE) 1 g tablet Take 1 tablet (1 g total) by mouth 4 (four) times daily for 15 days. 07/16/18 07/31/18  Jene Every, MD    Allergies Morphine and related; Penicillins; and Sulfa antibiotics  Family History  Problem Relation Age of Onset  . Hypertension Mother   . Stroke Mother   . Hypertension Father     Social History Social History   Tobacco Use  . Smoking status: Never Smoker  . Smokeless tobacco: Never Used  Substance Use Topics  . Alcohol use: Yes    Comment: socially  . Drug use: Not on file    Review of Systems Constitutional: No fever/chills Eyes: No visual changes. ENT: No sore throat. Cardiovascular: Positive for chest pain. Respiratory: Positive for shortness of breath. Gastrointestinal: No abdominal pain.  No nausea, no vomiting.  No diarrhea.   Genitourinary: Negative for dysuria. Musculoskeletal: Negative for back pain. Skin: Negative for rash. Neurological: Negative for headaches, focal weakness or numbness.  ____________________________________________   PHYSICAL EXAM:  VITAL SIGNS: ED Triage Vitals  Enc Vitals Group     BP 12/08/18 0712 (!) 147/95     Pulse Rate 12/08/18 0712 68     Resp 12/08/18 0712 20     Temp 12/08/18 0712 98.4  F (36.9 C)     Temp Source 12/08/18 0712 Oral     SpO2 12/08/18 0712 100 %     Weight 12/08/18 0704 181 lb (82.1 kg)     Height 12/08/18 0704 5\' 4"  (1.626 m)     Head Circumference --      Peak Flow --      Pain Score 12/08/18 0704 5   Constitutional: Alert and oriented.  Eyes: Conjunctivae are normal.  ENT      Head: Normocephalic and atraumatic.      Nose: No congestion/rhinnorhea.      Mouth/Throat: Mucous membranes are moist.      Neck: No stridor. Hematological/Lymphatic/Immunilogical: No cervical  lymphadenopathy. Cardiovascular: Normal rate, regular rhythm.  No murmurs, rubs, or gallops.  Respiratory: Normal respiratory effort without tachypnea nor retractions. Breath sounds are clear and equal bilaterally. No wheezes/rales/rhonchi. Gastrointestinal: Soft and non tender. No rebound. No guarding.  Genitourinary: Deferred Musculoskeletal: Normal range of motion in all extremities. No lower extremity edema. Neurologic:  Normal speech and language. No gross focal neurologic deficits are appreciated.  Skin:  Skin is warm, dry and intact. No rash noted. Psychiatric: Mood and affect are normal. Speech and behavior are normal. Patient exhibits appropriate insight and judgment.  ____________________________________________    LABS (pertinent positives/negatives)  Trop <0.03 CBC wbc 5.4, hgb 12.3, plt 286 CMP wnl except co2 21, glu 138  ____________________________________________   EKG    I, Phineas Semen, attending physician, personally viewed and interpreted this EKG  EKG Time: 0723 Rate: 69 Rhythm: normal sinus rhythm Axis: normal Intervals: qtc 450 QRS: narrow ST changes: no st elevation, t wave inversion III, aVF, V3-V5 Impression: abnormal ekg   ____________________________________________    RADIOLOGY  CXR No acute abnormality  ____________________________________________   PROCEDURES  Procedures  ____________________________________________   INITIAL IMPRESSION / ASSESSMENT AND PLAN / ED COURSE  Pertinent labs & imaging results that were available during my care of the patient were reviewed by me and considered in my medical decision making (see chart for details).   Patient presented to the emergency department today with primary complaint of some shortness of breath.  She was also complaining of some chest tightness.  Chest x-ray without any pneumonia or pneumothorax.  At this point I doubt PE ACS or dissection.  She did feel better after  breathing treatments and steroids.  This point I think likely upper respiratory infection possible asthma exacerbation. Will plan on discharging home with prescription for steroids and albuterol inhaler. Discussed plans with patient.   ____________________________________________   FINAL CLINICAL IMPRESSION(S) / ED DIAGNOSES  Final diagnoses:  Upper respiratory tract infection, unspecified type     Note: This dictation was prepared with Dragon dictation. Any transcriptional errors that result from this process are unintentional     Phineas Semen, MD 12/08/18 1340

## 2019-01-05 ENCOUNTER — Encounter: Payer: Self-pay | Admitting: *Deleted

## 2019-01-05 ENCOUNTER — Emergency Department
Admission: EM | Admit: 2019-01-05 | Discharge: 2019-01-05 | Disposition: A | Discharge status: Home / Self Care | Payer: Medicaid Other | Attending: Emergency Medicine | Admitting: Emergency Medicine

## 2019-01-05 ENCOUNTER — Other Ambulatory Visit: Payer: Self-pay

## 2019-01-05 DIAGNOSIS — Z79899 Other long term (current) drug therapy: Secondary | ICD-10-CM | POA: Insufficient documentation

## 2019-01-05 DIAGNOSIS — J019 Acute sinusitis, unspecified: Secondary | ICD-10-CM | POA: Insufficient documentation

## 2019-01-05 DIAGNOSIS — J45909 Unspecified asthma, uncomplicated: Secondary | ICD-10-CM | POA: Insufficient documentation

## 2019-01-05 MED ORDER — FLUTICASONE PROPIONATE 50 MCG/ACT NA SUSP: 1.0000 | Freq: Every day | NASAL | 0 refills | Status: DC

## 2019-01-05 NOTE — ED Provider Notes (Signed)
[facility] Emergency Department Provider Note    ____________________________________________   I have reviewed the triage vital signs and the nursing notes.   HISTORY  Chief Complaint Nasal Congestion and Headache   History limited by: Not Limited   HPI Meredith Harris is a 43 y.o. female who presents to the emergency department today because of concern about your headache.  Patient states that the symptoms started approximately 3 days ago.  She has tried some Benadryl without any significant relief. Also tried allegra without any significant relief. has had drainage of yellow mucus. Told me that she had max temperature of 100. She has also had some right ear pain.    Records reviewed. Per medical record review patient has a history of asthma.   Past Medical History:  Diagnosis Date  . Asthma     Patient Active Problem List   Diagnosis Date Noted  . Sepsis (HCC) 05/09/2018    Past Surgical History:  Procedure Laterality Date  . CHOLECYSTECTOMY      Prior to Admission medications   Medication Sig Start Date End Date Taking? Authorizing Provider  acetaminophen (TYLENOL) 325 MG tablet Take 2 tablets (650 mg total) by mouth every 6 (six) hours as needed for mild pain (or Fever >/= 101). 05/12/18   Gouru, Deanna ArtisAruna, MD  albuterol (PROVENTIL HFA;VENTOLIN HFA) 108 (90 Base) MCG/ACT inhaler Inhale 2 puffs into the lungs every 6 (six) hours as needed for wheezing or shortness of breath. 12/08/18   Phineas SemenGoodman, Arseniy Toomey, MD  clindamycin (CLEOCIN) 300 MG capsule Take 1 capsule (300 mg total) by mouth 4 (four) times daily. 11/14/18   Cuthriell, Delorise RoyalsJonathan D, PA-C  doxycycline (VIBRA-TABS) 100 MG tablet Take 1 tablet (100 mg total) by mouth every 12 (twelve) hours. 05/12/18   Gouru, Deanna ArtisAruna, MD  fluticasone (FLONASE) 50 MCG/ACT nasal spray Place 1 spray into both nostrils daily. 01/05/19 01/05/20  Phineas SemenGoodman, Devonda Pequignot, MD  ibuprofen (ADVIL) 200 MG tablet Take 2 tablets (400 mg total)  by mouth every 6 (six) hours as needed. 05/12/18   Gouru, Deanna ArtisAruna, MD  pantoprazole (PROTONIX) 40 MG tablet Take 1 tablet (40 mg total) by mouth daily. 07/16/18 07/16/19  Jene EveryKinner, Robert, MD  predniSONE (STERAPRED UNI-PAK 21 TAB) 10 MG (21) TBPK tablet Per packaging instructions 12/08/18   Phineas SemenGoodman, Nuel Dejaynes, MD  saccharomyces boulardii (FLORASTOR) 250 MG capsule Take 1 capsule (250 mg total) by mouth 2 (two) times daily. 05/12/18   Gouru, Deanna ArtisAruna, MD  sucralfate (CARAFATE) 1 g tablet Take 1 tablet (1 g total) by mouth 4 (four) times daily for 15 days. 07/16/18 07/31/18  Jene EveryKinner, Robert, MD    Allergies Morphine and related; Penicillins; and Sulfa antibiotics  Family History  Problem Relation Age of Onset  . Hypertension Mother   . Stroke Mother   . Hypertension Father     Social History Social History   Tobacco Use  . Smoking status: Never Smoker  . Smokeless tobacco: Never Used  Substance Use Topics  . Alcohol use: Yes    Comment: socially  . Drug use: Not on file    Review of Systems Constitutional: Positive for max temp of 100 Eyes: No visual changes. ENT: Positive for right ear pain, nasal congestions.  Cardiovascular: Denies chest pain. Respiratory: Denies shortness of breath. Gastrointestinal: No abdominal pain.  No nausea, no vomiting.  No diarrhea.   Genitourinary: Negative for dysuria. Musculoskeletal: Negative for back pain. Skin: Negative for rash. Neurological: Negative for headaches, focal weakness or numbness.  ____________________________________________  PHYSICAL EXAM:  VITAL SIGNS: ED Triage Vitals  Enc Vitals Group     BP 01/05/19 1729 (!) 146/90     Pulse Rate 01/05/19 1729 (!) 103     Resp 01/05/19 1729 16     Temp 01/05/19 1729 98.4 F (36.9 C)     Temp Source 01/05/19 1729 Oral     SpO2 01/05/19 1729 99 %     Weight 01/05/19 1730 181 lb (82.1 kg)     Height 01/05/19 1730 5\' 4"  (1.626 m)     Head Circumference --      Peak Flow --     Constitutional: Alert and oriented.  Eyes: Conjunctivae are normal.  ENT      Head: Normocephalic and atraumatic.      Nose: No congestion/rhinnorhea.      Ears: Bilateral TMs without erythema or bulging.       Mouth/Throat: Mucous membranes are moist.      Neck: No stridor. Hematological/Lymphatic/Immunilogical: No cervical lymphadenopathy. Cardiovascular: Normal rate, regular rhythm.  No murmurs, rubs, or gallops.  Respiratory: Normal respiratory effort without tachypnea nor retractions. Breath sounds are clear and equal bilaterally. No wheezes/rales/rhonchi. Gastrointestinal: Soft and non tender. No rebound. No guarding.  Genitourinary: Deferred Musculoskeletal: Normal range of motion in all extremities. No lower extremity edema. Neurologic:  Normal speech and language. No gross focal neurologic deficits are appreciated.  Skin:  Skin is warm, dry and intact. No rash noted. Psychiatric: Mood and affect are normal. Speech and behavior are normal. Patient exhibits appropriate insight and judgment.  ____________________________________________    LABS (pertinent positives/negatives)  None  ____________________________________________   EKG  None  ____________________________________________    RADIOLOGY  None  ____________________________________________   PROCEDURES  Procedures  ____________________________________________   INITIAL IMPRESSION / ASSESSMENT AND PLAN / ED COURSE  Pertinent labs & imaging results that were available during my care of the patient were reviewed by me and considered in my medical decision making (see chart for details).   Patient presented to the emergency department today with signs and symptoms sinusitis. Patient afebrile here. Lungs clear. Doubt intracranial process as cause of headache. Will discharge with flonase. Discussed nasal irrigation.  The patient was evaluated in Emergency Department today for the symptoms described in  the history of present illness. She was evaluated in the context of the global COVID-19 pandemic, which necessitated consideration that the patient might be at risk for infection with the SARS-CoV-2 virus that causes COVID-19. Institutional protocols and algorithms that pertain to the evaluation of patients at risk for COVID-19 are in a state of rapid change based on information released by regulatory bodies including the CDC and federal and state organizations. These policies and algorithms were followed during the patient's care in the ED.   ____________________________________________   FINAL CLINICAL IMPRESSION(S) / ED DIAGNOSES  Final diagnoses:  Acute sinusitis, recurrence not specified, unspecified location     Note: This dictation was prepared with Dragon dictation. Any transcriptional errors that result from this process are unintentional     Phineas Semen, MD 01/05/19 1749

## 2019-01-05 NOTE — ED Notes (Signed)
Sinus pain and congestion a few days. Patient evaluated by Md discharged to home.

## 2019-01-05 NOTE — ED Triage Notes (Addendum)
Pt to ED reporting nasal congestion and drainage that has been yellow in color x three days. PT also reports pressure in her forehead. No cough or SOB reported. NO travel and no exposure to others that have been sick. Pt also reporting a temp of 100.5 at home this afternoon. Pt is afebrile upon arrival to ED at 98.4.

## 2019-01-05 NOTE — Discharge Instructions (Addendum)
Please seek medical attention for any high fevers, chest pain, shortness of breath, change in behavior, persistent vomiting, bloody stool or any other new or concerning symptoms.  

## 2019-05-12 ENCOUNTER — Emergency Department
Admission: EM | Admit: 2019-05-12 | Discharge: 2019-05-12 | Disposition: A | Discharge status: Home / Self Care | Payer: HRSA Program | Attending: Student | Admitting: Student

## 2019-05-12 ENCOUNTER — Emergency Department: Payer: HRSA Program

## 2019-05-12 ENCOUNTER — Other Ambulatory Visit: Payer: Self-pay

## 2019-05-12 DIAGNOSIS — U071 COVID-19: Secondary | ICD-10-CM | POA: Insufficient documentation

## 2019-05-12 DIAGNOSIS — J45909 Unspecified asthma, uncomplicated: Secondary | ICD-10-CM | POA: Diagnosis not present

## 2019-05-12 DIAGNOSIS — Z79899 Other long term (current) drug therapy: Secondary | ICD-10-CM | POA: Insufficient documentation

## 2019-05-12 DIAGNOSIS — Z20822 Contact with and (suspected) exposure to covid-19: Secondary | ICD-10-CM

## 2019-05-12 DIAGNOSIS — J111 Influenza due to unidentified influenza virus with other respiratory manifestations: Secondary | ICD-10-CM | POA: Insufficient documentation

## 2019-05-12 DIAGNOSIS — R0602 Shortness of breath: Secondary | ICD-10-CM | POA: Diagnosis not present

## 2019-05-12 DIAGNOSIS — R5383 Other fatigue: Secondary | ICD-10-CM | POA: Insufficient documentation

## 2019-05-12 DIAGNOSIS — R05 Cough: Secondary | ICD-10-CM | POA: Insufficient documentation

## 2019-05-12 DIAGNOSIS — R6889 Other general symptoms and signs: Secondary | ICD-10-CM

## 2019-05-12 DIAGNOSIS — R509 Fever, unspecified: Secondary | ICD-10-CM

## 2019-05-12 DIAGNOSIS — M7918 Myalgia, other site: Secondary | ICD-10-CM | POA: Diagnosis present

## 2019-05-12 LAB — CBC
HCT: 33.2 % — ABNORMAL LOW (ref 36.0–46.0)
Hemoglobin: 11.5 g/dL — ABNORMAL LOW (ref 12.0–15.0)
MCH: 30 pg (ref 26.0–34.0)
MCHC: 34.6 g/dL (ref 30.0–36.0)
MCV: 86.7 fL (ref 80.0–100.0)
Platelets: 193 10*3/uL (ref 150–400)
RBC: 3.83 MIL/uL — ABNORMAL LOW (ref 3.87–5.11)
RDW: 13.2 % (ref 11.5–15.5)
WBC: 5.4 10*3/uL (ref 4.0–10.5)
nRBC: 0 % (ref 0.0–0.2)

## 2019-05-12 LAB — BASIC METABOLIC PANEL
Anion gap: 9 (ref 5–15)
BUN: 11 mg/dL (ref 6–20)
CO2: 22 mmol/L (ref 22–32)
Calcium: 8.4 mg/dL — ABNORMAL LOW (ref 8.9–10.3)
Chloride: 107 mmol/L (ref 98–111)
Creatinine, Ser: 1.09 mg/dL — ABNORMAL HIGH (ref 0.44–1.00)
GFR calc Af Amer: >60 mL/min (ref >60)
GFR calc non Af Amer: >60 mL/min (ref >60)
Glucose, Bld: 98 mg/dL (ref 70–99)
Potassium: 3.1 mmol/L — ABNORMAL LOW (ref 3.5–5.1)
Sodium: 138 mmol/L (ref 135–145)

## 2019-05-12 LAB — TROPONIN I (HIGH SENSITIVITY): Troponin I (High Sensitivity): 7 ng/L (ref <18)

## 2019-05-12 MED ORDER — ACETAMINOPHEN 325 MG PO TABS
650.0000 mg | ORAL_TABLET | Freq: Once | ORAL | Status: AC | PRN
  Administered 2019-05-12: 650 mg via ORAL
  Filled 2019-05-12: qty 2

## 2019-05-12 MED ORDER — IBUPROFEN 600 MG PO TABS
600.0000 mg | ORAL_TABLET | Freq: Once | ORAL | Status: AC
  Administered 2019-05-12: 22:00:00 600 mg via ORAL
  Filled 2019-05-12: qty 1

## 2019-05-12 NOTE — ED Provider Notes (Addendum)
[facility] Emergency Department Provider Note  ____________________________________________   First MD Initiated Contact with Patient 05/12/19 2051     (approximate)  I have reviewed the triage vital signs and the nursing notes.  History  Chief Complaint Shortness of Breath and Chest Pain    HPI Meredith Harris is a 43 y.o. female with a history of asthma, who presents the emergency department for 5 days of body aches, fevers and chills, non-productive cough, shortness of breath, chest discomfort, some loose stool, and fatigue, especially on exertion. No dysuria, malodorous urine.  She has been taking over-the-counter medications, including TheraFlu, with no improvement.  She states she has been exposed to a sick contact, who was swabbed for COVID, but she does not know the results of that person's swab.         Past Medical Hx Past Medical History:  Diagnosis Date  . Asthma     Problem List Patient Active Problem List   Diagnosis Date Noted  . Sepsis (HCC) 05/09/2018    Past Surgical Hx Past Surgical History:  Procedure Laterality Date  . CHOLECYSTECTOMY      Medications Prior to Admission medications   Medication Sig Start Date End Date Taking? Authorizing Provider  acetaminophen (TYLENOL) 325 MG tablet Take 2 tablets (650 mg total) by mouth every 6 (six) hours as needed for mild pain (or Fever >/= 101). 05/12/18   Gouru, Deanna ArtisAruna, MD  albuterol (PROVENTIL HFA;VENTOLIN HFA) 108 (90 Base) MCG/ACT inhaler Inhale 2 puffs into the lungs every 6 (six) hours as needed for wheezing or shortness of breath. 12/08/18   Phineas SemenGoodman, Graydon, MD  clindamycin (CLEOCIN) 300 MG capsule Take 1 capsule (300 mg total) by mouth 4 (four) times daily. 11/14/18   Cuthriell, Delorise RoyalsJonathan D, PA-C  doxycycline (VIBRA-TABS) 100 MG tablet Take 1 tablet (100 mg total) by mouth every 12 (twelve) hours. 05/12/18   Gouru, Deanna ArtisAruna, MD  fluticasone (FLONASE) 50 MCG/ACT nasal spray Place 1  spray into both nostrils daily. 01/05/19 01/05/20  Phineas SemenGoodman, Graydon, MD  ibuprofen (ADVIL) 200 MG tablet Take 2 tablets (400 mg total) by mouth every 6 (six) hours as needed. 05/12/18   Gouru, Deanna ArtisAruna, MD  pantoprazole (PROTONIX) 40 MG tablet Take 1 tablet (40 mg total) by mouth daily. 07/16/18 07/16/19  Jene EveryKinner, Robert, MD  predniSONE (STERAPRED UNI-PAK 21 TAB) 10 MG (21) TBPK tablet Per packaging instructions 12/08/18   Phineas SemenGoodman, Graydon, MD  saccharomyces boulardii (FLORASTOR) 250 MG capsule Take 1 capsule (250 mg total) by mouth 2 (two) times daily. 05/12/18   Gouru, Deanna ArtisAruna, MD  sucralfate (CARAFATE) 1 g tablet Take 1 tablet (1 g total) by mouth 4 (four) times daily for 15 days. 07/16/18 07/31/18  Jene EveryKinner, Robert, MD    Allergies Morphine and related, Penicillins, and Sulfa antibiotics  Family Hx Family History  Problem Relation Age of Onset  . Hypertension Mother   . Stroke Mother   . Hypertension Father     Social Hx Social History   Tobacco Use  . Smoking status: Never Smoker  . Smokeless tobacco: Never Used  Substance Use Topics  . Alcohol use: Yes    Comment: socially  . Drug use: Not on file     Review of Systems  Constitutional: + for fever. + for chills. Eyes: Negative for visual changes. ENT: Negative for sore throat. Cardiovascular: + for chest pain. Respiratory: + for shortness of breath. Gastrointestinal: Negative for abdominal pain. Negative for nausea. + for diarrhea Genitourinary: Negative  for dysuria. Musculoskeletal: Negative for leg swelling. Skin: Negative for rash. Neurological: Negative for for headaches.   Physical Exam  Vital Signs: ED Triage Vitals  Enc Vitals Group     BP 05/12/19 2018 139/87     Pulse Rate 05/12/19 2018 81     Resp 05/12/19 2018 18     Temp 05/12/19 2018 (!) 102.7 F (39.3 C)     Temp Source 05/12/19 2018 Oral     SpO2 05/12/19 2018 100 %     Weight 05/12/19 2020 200 lb (90.7 kg)     Height 05/12/19 2020 5\' 4"  (1.626 m)      Head Circumference --      Peak Flow --      Pain Score 05/12/19 2018 10     Pain Loc --      Pain Edu? --      Excl. in [place]? --     Constitutional: Alert and oriented.  Eyes: Conjunctivae clear. Sclera anicteric. Head: Normocephalic. Atraumatic. Nose: No congestion. No rhinorrhea. Mouth/Throat: Mucous membranes are moist.  Neck: No stridor.   Cardiovascular: Normal rate, regular rhythm. No murmurs. Extremities well perfused. Respiratory: Normal respiratory effort.  Lungs CTAB. Gastrointestinal: Soft and non-tender. No distention.  Musculoskeletal: No lower extremity edema. Neurologic:  Normal speech and language. No gross focal neurologic deficits are appreciated.  Skin: Skin is warm, dry and intact. No rash noted. Psychiatric: Mood and affect are appropriate for situation.  EKG  Personally reviewed.   Rate: 87 Rhythm: sinus Axis: normal Intervals: WNL TWI in III, aVF, which are unchanged from prior Non specific ST/T wave changes No acute ischemic changes   Radiology  XR: IMPRESSION:  No acute cardiopulmonary process.    Procedures  Procedure(s) performed (including critical care):  Procedures   Initial Impression / Assessment and Plan / ED Course  43 y.o. female with history of asthma who presents to the ED for 5 days of fevers, chills, body aches, cough, shortness of breath, fatigue.  She does have a positive sick contact who was evaluated for COVID, she is unsure of that person's test results.  On exam she is febrile, remainder vitals within normal limits. Lungs clear, satting well on RA.  X-rays negative, EKG unchanged from prior, HS troponin 7.   Presentation consistent with viral process, concerning for COVID especially given sick contact. Will plan for swab. As she is otherwise HDS, fever improved with antipyretics, not in respiratory distress, will plan for discharge. She is aware of the need to quarantine until she receives results. Given return  precautions.   Final Clinical Impression(s) / ED Diagnosis  Final diagnoses:  Fever in adult  Flu-like symptoms  Suspected Covid-19 Virus Infection       Note:  This document was prepared using Dragon voice recognition software and may include unintentional dictation errors.   Lilia Pro., MD 05/13/19 3532    Lilia Pro., MD 05/13/19 [phone]

## 2019-05-12 NOTE — ED Triage Notes (Signed)
Patient c/o chest pain, SOB, productive cough X 1 week. Patient reports she has been around someone who was tested for COVID, no results as yet.

## 2019-05-12 NOTE — ED Notes (Signed)
Patient reports symptoms began Monday and progressed over the week.  Reports today was the worse. States has been taking OTC medicines without relief.   Patient reports a "friend" has been tested but does not have results as of yet.    Patient reports pain to chest is in upper chest and is worse with coughing.

## 2019-05-12 NOTE — Discharge Instructions (Addendum)
Thank you for letting us take care of you in the emergency department.   At this time, your coronavirus swab results are pending. You should hear your results in 2-4 days if they are positive.   In the meantime, it is important to take precautions in case you are positive. This includes quarantining, wearing a mask, and social distancing.   Continue to take over the counter acetaminophen and ibuprofen as directed on the box to help with fevers as well as aches and pains.   Please return to the ER for any new or worsening symptoms, such as difficulty breathing, vomiting and diarrhea, or chest pain.

## 2019-05-13 LAB — SARS CORONAVIRUS 2 (TAT 6-24 HRS): SARS Coronavirus 2: POSITIVE — AB

## 2019-11-07 IMAGING — CT CT ABD-PELV W/ CM
2 of 5 series · 16 of 46 positions shown, 18 images · IV contrast (iopamidol)
Comparison: 05/09/2018

CLINICAL DATA: Abdominal pain, diverticulitis suspected

EXAM:
CT ABDOMEN AND PELVIS WITH CONTRAST
TECHNIQUE: Multidetector CT imaging of the abdomen and pelvis was performed
using the standard protocol following bolus administration of
intravenous contrast.
CONTRAST:  100mL D1IDS8-OCC IOPAMIDOL (D1IDS8-OCC) INJECTION 61%

[Series 2: routine abd/pel with · axial · 0.65mm/px · z∈[-992,-582]mm · 13 of 92 slices shown, 15 images]
[im 5/92  soft-tissue]
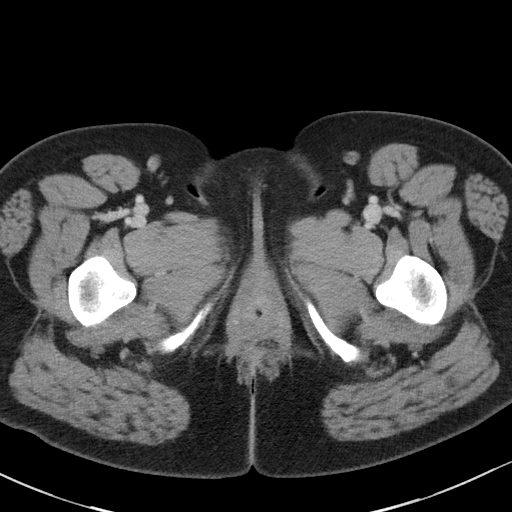
[im 5/92  bone]
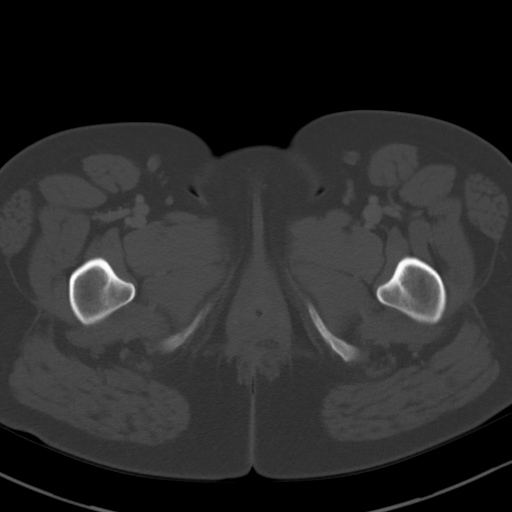
[im 14/92  soft-tissue]
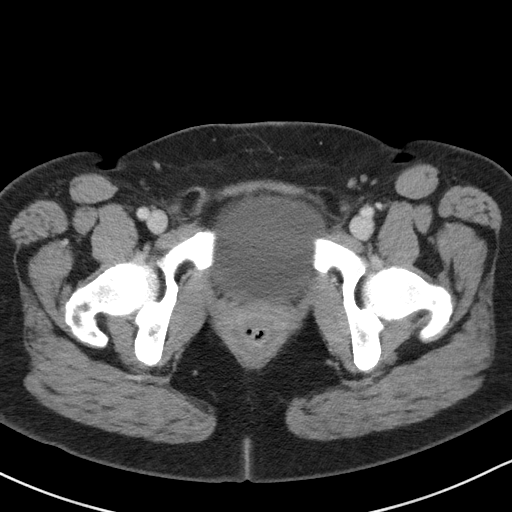
[im 19/92  soft-tissue]
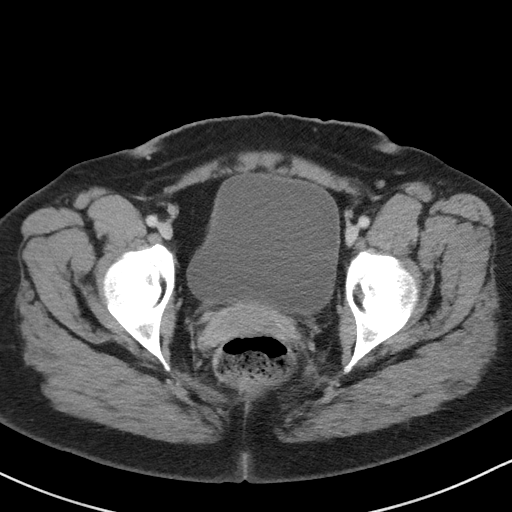
[im 28/92  soft-tissue]
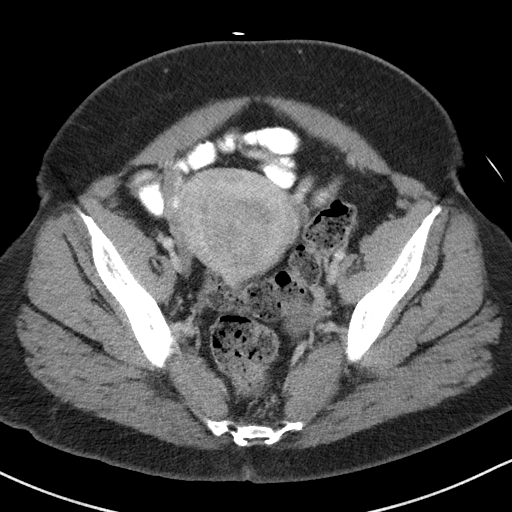
[im 32/92  soft-tissue]
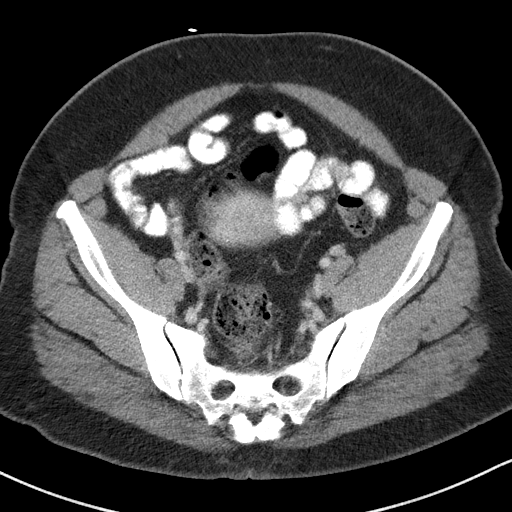
[im 41/92  soft-tissue]
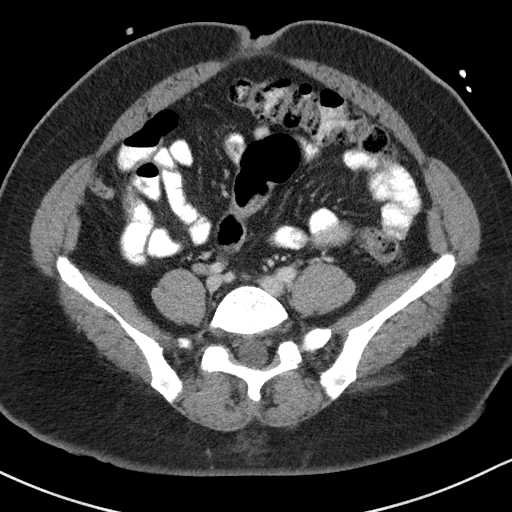
[im 46/92  soft-tissue]
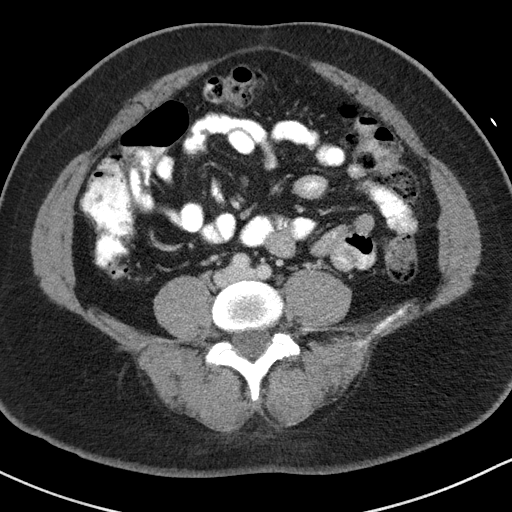
[im 51/92  soft-tissue]
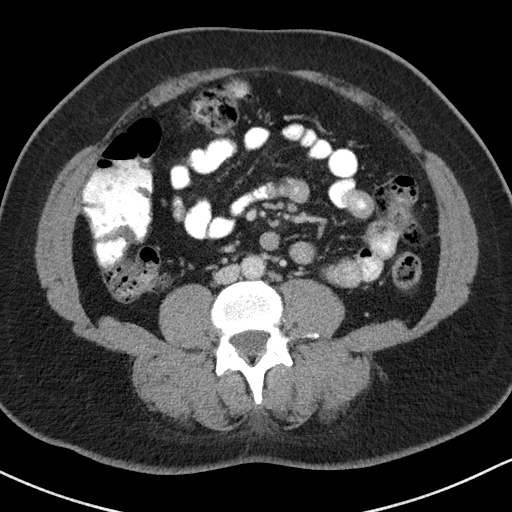
[im 60/92  soft-tissue]
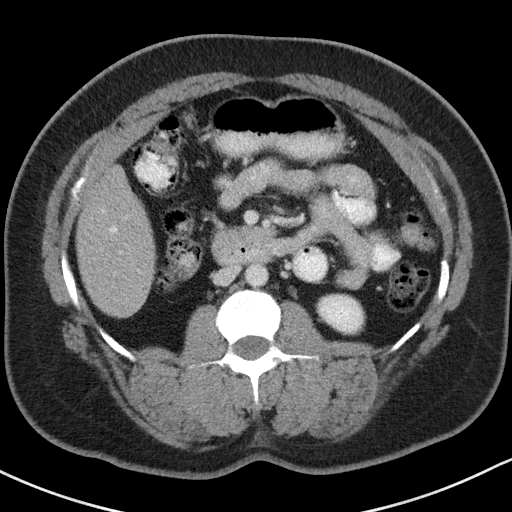
[im 60/92  bone]
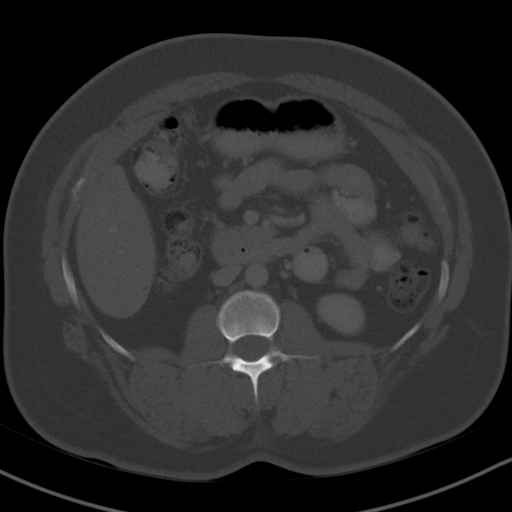
[im 64/92  soft-tissue]
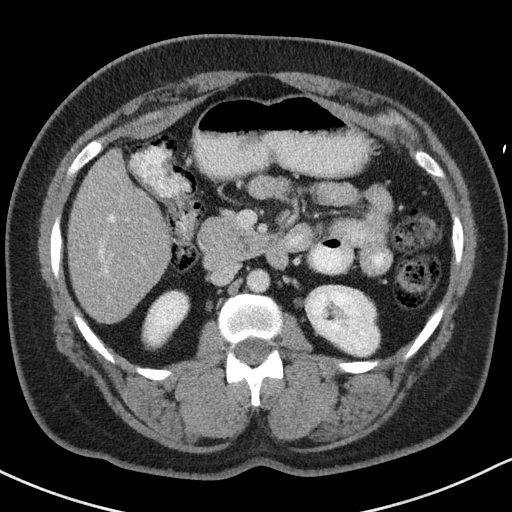
[im 73/92  soft-tissue]
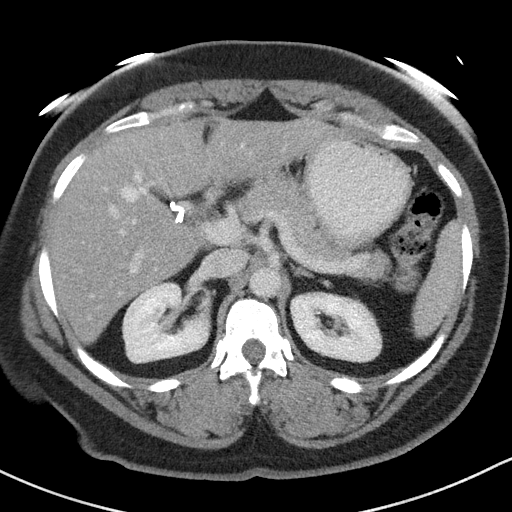
[im 78/92  soft-tissue]
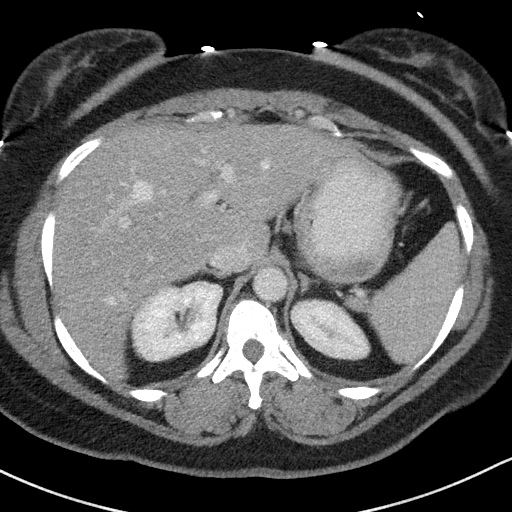
[im 87/92  soft-tissue]
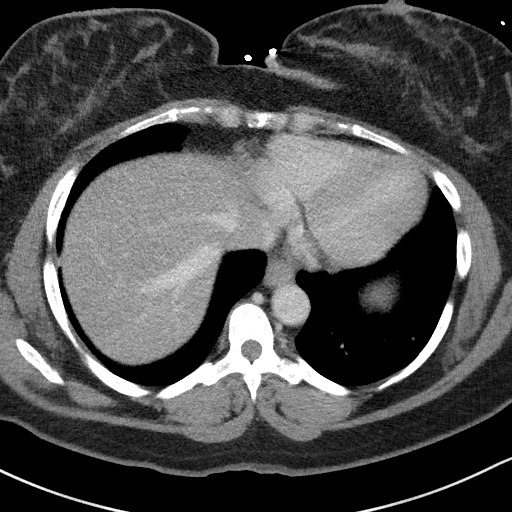

[Series 5: coronal st · coronal · 0.76mm/px · 3 of 99 slices shown]
[im 33/99  soft-tissue]
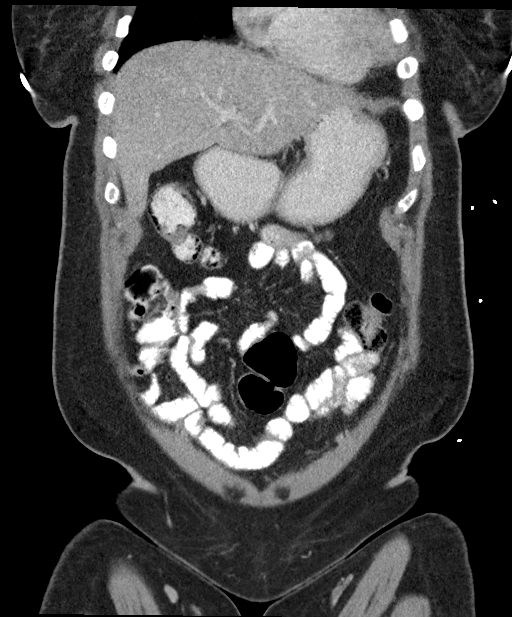
[im 44/99  soft-tissue]
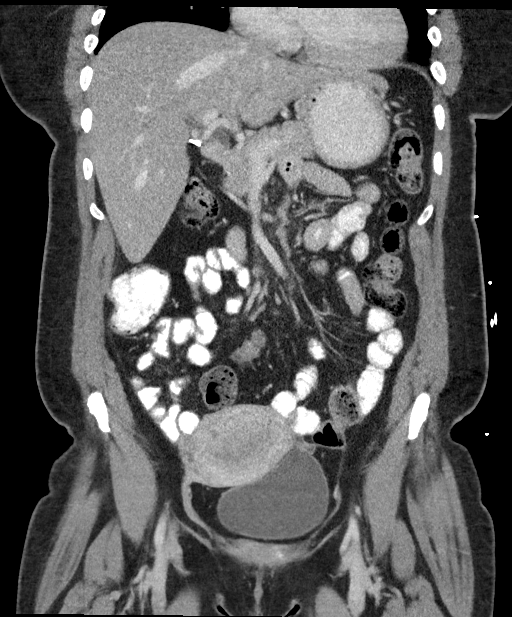
[im 55/99  soft-tissue]
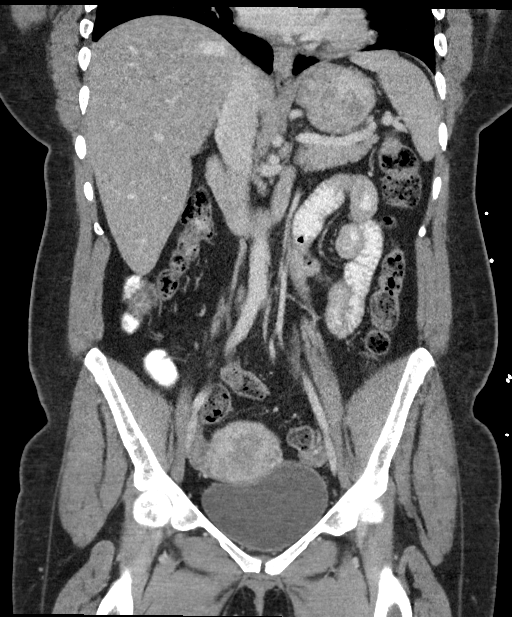

[16 of 46 positions shown; findings below may reference images not displayed]

FINDINGS: Lower chest:  No contributory findings.

Hepatobiliary: Hepatic steatosis and hepatic enlargement- the right
liver tip extends below the right kidney.Cholecystectomy with normal
common bile duct diameter

Pancreas: Unremarkable.

Spleen: Unremarkable.

Adrenals/Urinary Tract: Negative adrenals. No hydronephrosis or
stone. Unremarkable bladder.

Stomach/Bowel:  No obstruction. No appendicitis.

Vascular/Lymphatic: No acute vascular abnormality. No mass or
adenopathy.

Reproductive:Right cornual region cystic change on prior has nearly
resolved. Dominant follicle on the left. Indistinct junctional zone
that is likely from contrast timing.

Other: No ascites or pneumoperitoneum.

Musculoskeletal: No acute abnormalities.
IMPRESSION: 1. No acute finding or explanation for abdominal pain.
2. Hepatic steatosis.

## 2019-12-11 ENCOUNTER — Other Ambulatory Visit: Payer: Self-pay

## 2019-12-11 ENCOUNTER — Ambulatory Visit: Payer: Self-pay | Admitting: Physician Assistant

## 2019-12-11 DIAGNOSIS — Z299 Encounter for prophylactic measures, unspecified: Secondary | ICD-10-CM

## 2019-12-11 DIAGNOSIS — Z113 Encounter for screening for infections with a predominantly sexual mode of transmission: Secondary | ICD-10-CM

## 2019-12-11 LAB — WET PREP FOR TRICH, YEAST, CLUE
Trichomonas Exam: NEGATIVE
Yeast Exam: NEGATIVE

## 2019-12-11 MED ORDER — CLOTRIMAZOLE 1 % VA CREA: 1.0000 | TOPICAL_CREAM | Freq: Every day | VAGINAL | 0 refills | Status: AC

## 2019-12-11 NOTE — Progress Notes (Signed)
Wet mount reviewed by provider; pt treated per provider orders. Provider orders completed. 

## 2019-12-12 ENCOUNTER — Encounter: Payer: Self-pay | Admitting: Physician Assistant

## 2019-12-12 NOTE — Progress Notes (Signed)
[facility] Department STI clinic/screening visit  Subjective:  Namya Voges is a 44 y.o. female being seen today for an STI screening visit. The patient reports they do have symptoms.  Patient reports that they do not desire a pregnancy in the next year.   They reported they are not interested in discussing contraception today.  No LMP recorded.   Patient has the following medical conditions:   Patient Active Problem List   Diagnosis Date Noted  . Sepsis (HCC) 05/09/2018    Chief Complaint  Patient presents with  . SEXUALLY TRANSMITTED DISEASE    STD screening    HPI  Patient reports that she has had vaginal itching for about 3 weeks.  States that she used OTC 3 day suppository treatment with Monistat about 2 weeks ago that reduced the symptoms some but did not resolve them totally.  LMP 01/2019, and s/p BTL.  See flowsheet for further details and programmatic requirements.    The following portions of the patient's history were reviewed and updated as appropriate: allergies, current medications, past medical history, past social history, past surgical history and problem list.  Objective:  There were no vitals filed for this visit.  Physical Exam Constitutional:      General: She is not in acute distress.    Appearance: Normal appearance. She is obese.  HENT:     Head: Normocephalic and atraumatic.     Comments: No nits, lice or hair loss. No cervical, supraclavicular or axillary adenopathy.    Mouth/Throat:     Mouth: Mucous membranes are moist.     Pharynx: Oropharynx is clear. No oropharyngeal exudate or posterior oropharyngeal erythema.  Eyes:     Conjunctiva/sclera: Conjunctivae normal.  Pulmonary:     Effort: Pulmonary effort is normal.  Abdominal:     Palpations: Abdomen is soft. There is no mass.     Tenderness: There is no abdominal tenderness. There is no guarding or rebound.  Genitourinary:    General: Normal vulva.     Rectum: Normal.   Comments: External genitalia/pubic area without nits, lice, edema, erythema, lesions and inguinal adenopathy. Vagina with normal mucosa and discharge. Cervix without visible lesions. Uterus firm, mobile, nt, no masses, no CMT, no adnexal tenderness. Musculoskeletal:     Cervical back: Neck supple. No tenderness.  Skin:    General: Skin is warm and dry.     Findings: No bruising, erythema, lesion or rash.  Neurological:     Mental Status: She is alert and oriented to person, place, and time.  Psychiatric:        Mood and Affect: Mood normal.        Behavior: Behavior normal.        Thought Content: Thought content normal.        Judgment: Judgment normal.      Assessment and Plan:  Shakaya Bhullar is a 44 y.o. female presenting to the [facility] Department for STI screening  1. Screening for STD (sexually transmitted disease) Patient into clinic with symptoms. Rec condoms with all sex. Await test results.  Counseled that RN will call if needs to RTC for treatment once results are back.  - WET PREP FOR TRICH, YEAST, CLUE - Chlamydia/Gonorrhea [place] [facility] - HIV [place] [facility] - Syphilis Serology, [place] [facility]  2. Prophylactic measure Will try Clotrimazole 1% vaginal cream for symptomatic relief while waiting for test results. - clotrimazole (CLOTRIMAZOLE-7) 1 % vaginal cream; Place 1 Applicatorful vaginally at  bedtime for 7 days.  Dispense: 45 g; Refill: 0  3.  Amenorrhea Rec that patient see PCP or Gyn for evaluation of no periods since May of 2020. PCP list given to patient to schedule appt.     No follow-ups on file.  No future appointments.  Jerene Dilling, PA

## 2020-01-24 ENCOUNTER — Other Ambulatory Visit (HOSPITAL_COMMUNITY)
Admission: RE | Admit: 2020-01-24 | Discharge: 2020-01-24 | Disposition: A | Discharge status: Home / Self Care | Payer: Medicaid Other | Source: Ambulatory Visit | Attending: Obstetrics and Gynecology | Admitting: Obstetrics and Gynecology

## 2020-01-24 ENCOUNTER — Other Ambulatory Visit: Payer: Self-pay

## 2020-01-24 ENCOUNTER — Ambulatory Visit (INDEPENDENT_AMBULATORY_CARE_PROVIDER_SITE_OTHER): Payer: Self-pay | Admitting: Obstetrics and Gynecology

## 2020-01-24 ENCOUNTER — Encounter: Payer: Self-pay | Admitting: Obstetrics and Gynecology

## 2020-01-24 VITALS — BP 118/80 | Ht 64.0 in | Wt 210.0 lb

## 2020-01-24 DIAGNOSIS — N912 Amenorrhea, unspecified: Secondary | ICD-10-CM

## 2020-01-24 DIAGNOSIS — Z1231 Encounter for screening mammogram for malignant neoplasm of breast: Secondary | ICD-10-CM

## 2020-01-24 DIAGNOSIS — Z1151 Encounter for screening for human papillomavirus (HPV): Secondary | ICD-10-CM | POA: Insufficient documentation

## 2020-01-24 DIAGNOSIS — Z780 Asymptomatic menopausal state: Secondary | ICD-10-CM

## 2020-01-24 DIAGNOSIS — Z8041 Family history of malignant neoplasm of ovary: Secondary | ICD-10-CM | POA: Insufficient documentation

## 2020-01-24 DIAGNOSIS — Z01419 Encounter for gynecological examination (general) (routine) without abnormal findings: Secondary | ICD-10-CM

## 2020-01-24 DIAGNOSIS — Z124 Encounter for screening for malignant neoplasm of cervix: Secondary | ICD-10-CM | POA: Diagnosis not present

## 2020-01-24 DIAGNOSIS — K644 Residual hemorrhoidal skin tags: Secondary | ICD-10-CM

## 2020-01-24 NOTE — Progress Notes (Signed)
PCP:  Patient, No Pcp Per   Chief Complaint  Patient presents with  . Gynecologic Exam     HPI:      Ms. Meredith Harris is a 44 y.o. No obstetric history on file. who LMP was Patient's last menstrual period was 01/27/2019 (approximate)., presents today for her NP > 3 yrs annual examination.  Her menses are absent since 5/21. Were Q2 months, lasting 7 days, mod to heavy flow, no BTB, mild dysmen. Menses then stopped and no BTB/dysmen since. Hx of menorrhagia/leio, controlled with depo years ago. Had stopped depo and was cycling Q2 mos off it before amenorrhea. She does have tolerable vasomotor sx.  Sex activity: single partner, contraception - tubal ligation. Has vaginal dryness, uses lubricants with sx relief. Last Pap: not recent; s/p cryotx age 59; normal paps since Hx of STDs: Neg STD testing at [facility] 3/21  Last mammogram: 2016  There is a FH of breast cancer in her mat aunt, genetic testing not indicated. There is a FH of ovarian cancer in her pat 1/2 sister, genetic testing not done. The patient does do self-breast exams.  Tobacco use: She smokes cigars. Alcohol use: none No drug use.  Exercise: moderately active  She does get adequate calcium but not Vitamin D in her diet.  Developed an ext hemorrhoid end of 2020. No hx of constipatino but doe get frequent loose stools after eating. Using Prep H but hemorrhoid persists. Only 1 episode blood with wiping after BM a few months ago.   Pt has FP MCD.   Past Medical History:  Diagnosis Date  . Asthma   . External hemorrhoid   . IBS (irritable bowel syndrome)   . Lyme disease     Past Surgical History:  Procedure Laterality Date  . CHOLECYSTECTOMY    . TUBAL LIGATION      Family History  Problem Relation Age of Onset  . Hypertension Mother   . Stroke Mother   . Hypertension Father   . Ovarian cancer Sister 64       1/2 sister on pat side  . Breast cancer Maternal Aunt 45    Social History   Socioeconomic  History  . Marital status: Single    Spouse name: Not on file  . Number of children: Not on file  . Years of education: Not on file  . Highest education level: Not on file  Occupational History  . Not on file  Tobacco Use  . Smoking status: Never Smoker  . Smokeless tobacco: Never Used  Substance and Sexual Activity  . Alcohol use: Yes    Comment: socially  . Drug use: Never  . Sexual activity: Yes    Partners: Male    Birth control/protection: None  Other Topics Concern  . Not on file  Social History Narrative  . Not on file   Social Determinants of Health   Financial Resource Strain:   . Difficulty of Paying Living Expenses:   Food Insecurity:   . Worried About Charity fundraiser in the Last Year:   . Arboriculturist in the Last Year:   Transportation Needs:   . Film/video editor (Medical):   Marland Kitchen Lack of Transportation (Non-Medical):   Physical Activity:   . Days of Exercise per Week:   . Minutes of Exercise per Session:   Stress:   . Feeling of Stress :   Social Connections:   . Frequency of Communication with Friends and Family:   .  Frequency of Social Gatherings with Friends and Family:   . Attends Religious Services:   . Active Member of Clubs or Organizations:   . Attends Archivist Meetings:   Marland Kitchen Marital Status:   Intimate Partner Violence:   . Fear of Current or Ex-Partner:   . Emotionally Abused:   Marland Kitchen Physically Abused:   . Sexually Abused:      Current Outpatient Medications:  .  albuterol (PROVENTIL HFA;VENTOLIN HFA) 108 (90 Base) MCG/ACT inhaler, Inhale 2 puffs into the lungs every 6 (six) hours as needed for wheezing or shortness of breath., Disp: 1 Inhaler, Rfl: 0 .  fluticasone (FLONASE) 50 MCG/ACT nasal spray, Place 1 spray into both nostrils daily., Disp: 16 g, Rfl: 0     ROS:  Review of Systems  Constitutional: Negative for fatigue, fever and unexpected weight change.  Respiratory: Negative for cough, shortness of breath  and wheezing.   Cardiovascular: Negative for chest pain, palpitations and leg swelling.  Gastrointestinal: Negative for blood in stool, constipation, diarrhea, nausea and vomiting.  Endocrine: Negative for cold intolerance, heat intolerance and polyuria.  Genitourinary: Negative for dyspareunia, dysuria, flank pain, frequency, genital sores, hematuria, menstrual problem, pelvic pain, urgency, vaginal bleeding, vaginal discharge and vaginal pain.  Musculoskeletal: Negative for back pain, joint swelling and myalgias.  Skin: Negative for rash.  Neurological: Negative for dizziness, syncope, light-headedness, numbness and headaches.  Hematological: Negative for adenopathy.  Psychiatric/Behavioral: Negative for agitation, confusion, sleep disturbance and suicidal ideas. The patient is not nervous/anxious.   BREAST: No symptoms   Objective: BP 118/80   Ht '5\' 4"'$  (1.626 m)   Wt 210 lb (95.3 kg)   LMP 01/27/2019 (Approximate)   BMI 36.05 kg/m    Physical Exam Constitutional:      Appearance: She is well-developed.  Genitourinary:     Vulva, vagina, cervix, uterus, right adnexa and left adnexa normal.     No vulval lesion or tenderness noted.     No vaginal discharge, erythema or tenderness.     No cervical polyp.     Uterus is not enlarged or tender.     No right or left adnexal mass present.     Right adnexa not tender.     Left adnexa not tender.  Rectum:     External hemorrhoid present.  Neck:     Thyroid: No thyromegaly.  Cardiovascular:     Rate and Rhythm: Normal rate and regular rhythm.     Heart sounds: Normal heart sounds. No murmur.  Pulmonary:     Effort: Pulmonary effort is normal.     Breath sounds: Normal breath sounds.  Chest:     Breasts:        Right: No mass, nipple discharge, skin change or tenderness.        Left: No mass, nipple discharge, skin change or tenderness.  Abdominal:     Palpations: Abdomen is soft.     Tenderness: There is no abdominal  tenderness. There is no guarding.  Musculoskeletal:        General: Normal range of motion.     Cervical back: Normal range of motion.  Neurological:     General: No focal deficit present.     Mental Status: She is alert and oriented to person, place, and time.     Cranial Nerves: No cranial nerve deficit.  Skin:    General: Skin is warm and dry.  Psychiatric:        Mood and Affect:  Mood normal.        Behavior: Behavior normal.        Thought Content: Thought content normal.        Judgment: Judgment normal.  Vitals reviewed.     Assessment/Plan: Encounter for annual routine gynecological examination  Cervical cancer screening - Plan: Cytology - PAP  Screening for HPV (human papillomavirus) - Plan: Cytology - PAP  Encounter for screening mammogram for malignant neoplasm of breast - Plan: MM 3D SCREEN BREAST BILATERAL; pt to sched mammo  Amenorrhea - Plan: Follicle stimulating hormone, Estradiol; Check labs. If premenopausal, will try provera withdrawal. Will f/u prn results.  Menopause - Plan: Follicle stimulating hormone, Estradiol  Family history of ovarian cancer - Plan: Integrated BRACAnalysis ([facility])--MyRisk testing discussed and done today. Will call with results.   External hemorrhoid--keep stools soft and firm. Reassurance. F/u prn.            GYN counsel breast self exam, mammography screening, menopause, adequate intake of calcium and vitamin D, diet and exercise  PT HAS FP MCD    F/U  Return in about 1 year (around 02/26/5614) for annual.  Beatrice Ziehm B. Lj Miyamoto, PA-C 01/24/2020 2:24 PM

## 2020-01-24 NOTE — Patient Instructions (Signed)
I value your feedback and entrusting us with your care. If you get a [facility] patient survey, I would appreciate you taking the time to let us know about your experience today. Thank you!  As of September 07, 2019, your lab results will be released to your MyChart immediately, before I even have a chance to see them. Please give me time to review them and contact you if there are any abnormalities. Thank you for your patience.  

## 2020-01-25 LAB — ESTRADIOL: Estradiol: 325 pg/mL

## 2020-01-25 LAB — FOLLICLE STIMULATING HORMONE: FSH: 6.2 m[IU]/mL

## 2020-01-25 MED ORDER — MEDROXYPROGESTERONE ACETATE 10 MG PO TABS: 10.0000 mg | ORAL_TABLET | Freq: Every day | ORAL | 0 refills | Status: DC

## 2020-01-25 NOTE — Addendum Note (Signed)
Addended by: Althea Grimmer B on: 01/25/2020 08:25 AM   Modules accepted: Orders

## 2020-01-26 LAB — CYTOLOGY - PAP
Comment: NEGATIVE
Diagnosis: NEGATIVE
High risk HPV: NEGATIVE

## 2020-01-27 DIAGNOSIS — Z1371 Encounter for nonprocreative screening for genetic disease carrier status: Secondary | ICD-10-CM

## 2020-01-27 HISTORY — DX: Encounter for nonprocreative screening for genetic disease carrier status: Z13.71

## 2020-02-05 ENCOUNTER — Encounter: Payer: Self-pay | Admitting: Obstetrics and Gynecology

## 2020-03-17 ENCOUNTER — Other Ambulatory Visit: Payer: Self-pay

## 2020-03-17 DIAGNOSIS — J45909 Unspecified asthma, uncomplicated: Secondary | ICD-10-CM | POA: Insufficient documentation

## 2020-03-17 DIAGNOSIS — N611 Abscess of the breast and nipple: Secondary | ICD-10-CM | POA: Insufficient documentation

## 2020-03-17 NOTE — ED Triage Notes (Signed)
Pt states lump under left breast that is painful. Pt states has been present for several days. Pt denies fever, no drainage noted from area.

## 2020-03-18 ENCOUNTER — Emergency Department
Admission: EM | Admit: 2020-03-18 | Discharge: 2020-03-18 | Disposition: A | Discharge status: Home / Self Care | Payer: PRIVATE HEALTH INSURANCE | Attending: Emergency Medicine | Admitting: Emergency Medicine

## 2020-03-18 ENCOUNTER — Emergency Department: Payer: PRIVATE HEALTH INSURANCE

## 2020-03-18 DIAGNOSIS — N611 Abscess of the breast and nipple: Secondary | ICD-10-CM

## 2020-03-18 LAB — CBC WITH DIFFERENTIAL/PLATELET
Abs Immature Granulocytes: 0.03 10*3/uL (ref 0.00–0.07)
Basophils Absolute: 0 10*3/uL (ref 0.0–0.1)
Basophils Relative: 1 %
Eosinophils Absolute: 0.3 10*3/uL (ref 0.0–0.5)
Eosinophils Relative: 4 %
HCT: 35.1 % — ABNORMAL LOW (ref 36.0–46.0)
Hemoglobin: 12.3 g/dL (ref 12.0–15.0)
Immature Granulocytes: 0 %
Lymphocytes Relative: 30 %
Lymphs Abs: 2.4 10*3/uL (ref 0.7–4.0)
MCH: 30 pg (ref 26.0–34.0)
MCHC: 35 g/dL (ref 30.0–36.0)
MCV: 85.6 fL (ref 80.0–100.0)
Monocytes Absolute: 0.5 10*3/uL (ref 0.1–1.0)
Monocytes Relative: 6 %
Neutro Abs: 4.8 10*3/uL (ref 1.7–7.7)
Neutrophils Relative %: 59 %
Platelets: 291 10*3/uL (ref 150–400)
RBC: 4.1 MIL/uL (ref 3.87–5.11)
RDW: 13.7 % (ref 11.5–15.5)
WBC: 8 10*3/uL (ref 4.0–10.5)
nRBC: 0 % (ref 0.0–0.2)

## 2020-03-18 LAB — BASIC METABOLIC PANEL
Anion gap: 6 (ref 5–15)
BUN: 11 mg/dL (ref 6–20)
CO2: 24 mmol/L (ref 22–32)
Calcium: 8.5 mg/dL — ABNORMAL LOW (ref 8.9–10.3)
Chloride: 110 mmol/L (ref 98–111)
Creatinine, Ser: 1 mg/dL (ref 0.44–1.00)
GFR calc Af Amer: >60 mL/min (ref >60)
GFR calc non Af Amer: >60 mL/min (ref >60)
Glucose, Bld: 108 mg/dL — ABNORMAL HIGH (ref 70–99)
Potassium: 3.6 mmol/L (ref 3.5–5.1)
Sodium: 140 mmol/L (ref 135–145)

## 2020-03-18 LAB — LACTIC ACID, PLASMA: Lactic Acid, Venous: 0.9 mmol/L (ref 0.5–1.9)

## 2020-03-18 MED ORDER — LIDOCAINE-EPINEPHRINE 2 %-1:100000 IJ SOLN
20.0000 mL | Freq: Once | INTRAMUSCULAR | Status: DC
  Filled 2020-03-18: qty 1

## 2020-03-18 MED ORDER — FENTANYL CITRATE (PF) 100 MCG/2ML IJ SOLN
50.0000 ug | Freq: Once | INTRAMUSCULAR | Status: AC
  Administered 2020-03-18: 50 ug via INTRAVENOUS
  Filled 2020-03-18: qty 2

## 2020-03-18 MED ORDER — ONDANSETRON HCL 4 MG/2ML IJ SOLN
4.0000 mg | Freq: Once | INTRAMUSCULAR | Status: AC
  Administered 2020-03-18: 4 mg via INTRAVENOUS
  Filled 2020-03-18: qty 2

## 2020-03-18 MED ORDER — CLINDAMYCIN HCL 300 MG PO CAPS: 300.0000 mg | ORAL_CAPSULE | Freq: Three times a day (TID) | ORAL | 0 refills | Status: AC

## 2020-03-18 MED ORDER — KETOROLAC TROMETHAMINE 30 MG/ML IJ SOLN
30.0000 mg | Freq: Once | INTRAMUSCULAR | Status: AC
  Administered 2020-03-18: 30 mg via INTRAVENOUS
  Filled 2020-03-18: qty 1

## 2020-03-18 NOTE — ED Notes (Signed)
Pt states coming in due to bumps under bilateral arm pits and the left breast. Pt states being seen by [facility] and has been on antibiotics for "hydro-something." Pt states she was admitted last year to the hospital for similar things.

## 2020-03-18 NOTE — ED Provider Notes (Addendum)
IMPRESSION: Likely 1.2 cm abscess in the LOWER OUTER LEFT breast at the 5 o'clock location superficially.  RECOMMENDATION: Very soon after completion of antibiotic therapy, the patient should be followed up at the [facility] for diagnostic mammography and repeat ultrasound to confirm resolution of the presumed abscess.  BI-RADS CATEGORY 3: Probably benign.  Ultrasound findings as dictated above.  Marland Kitchen.Incision and Drainage  Date/Time: 03/18/2020 8:20 AM Performed by: Emily Filbert, MD Authorized by: Emily Filbert, MD   Consent:    Consent obtained:  Verbal   Consent given by:  Patient Location:    Type:  Abscess   Location:  Trunk   Trunk location:  L breast Anesthesia (see MAR for exact dosages):    Anesthesia method:  Local infiltration   Local anesthetic:  Lidocaine 1% WITH epi Procedure type:    Complexity:  Simple Procedure details:    Incision types:  Single straight   Scalpel blade:  11   Drainage:  Purulent   Wound treatment:  Drain placed   Packing materials:  1/4 in gauze Post-procedure details:    Patient tolerance of procedure:  Tolerated well, no immediate complications      Emily Filbert, MD 03/18/20 0600    Emily Filbert, MD 03/18/20 [phone]

## 2020-03-18 NOTE — ED Provider Notes (Signed)
[facility] Emergency Department Provider Note  ____________________________________________  Time seen: Approximately 7:07 AM  I have reviewed the triage vital signs and the nursing notes.   HISTORY  Chief Complaint Breast Mass   HPI Meredith Harris is a 44 y.o. female with a strong family history of ovarian and breast cancer, BRCA negative presents for evaluation of breast mass.  Patient has had a mass on her left breast for the last few days.  She reports that is very tender to the touch and she has a lot of pain in it.  Pain is sharp and constant.  No chest pain or shortness of breath.  No fever or chills.   Past Medical History:  Diagnosis Date  . Asthma   . BRCA negative 01/2020   MyRisk neg; IBIS=10.8%  . External hemorrhoid   . Family history of breast cancer   . Family history of ovarian cancer   . IBS (irritable bowel syndrome)   . Lyme disease     Patient Active Problem List   Diagnosis Date Noted  . Amenorrhea 01/24/2020  . Family history of ovarian cancer 01/24/2020  . External hemorrhoid 01/24/2020  . Sepsis ([place]) 05/09/2018    Past Surgical History:  Procedure Laterality Date  . CHOLECYSTECTOMY    . TUBAL LIGATION      Prior to Admission medications   Medication Sig Start Date End Date Taking? Authorizing Provider  albuterol (PROVENTIL HFA;VENTOLIN HFA) 108 (90 Base) MCG/ACT inhaler Inhale 2 puffs into the lungs every 6 (six) hours as needed for wheezing or shortness of breath. 12/08/18   Nance Pear, MD  fluticasone [facility]) 50 MCG/ACT nasal spray Place 1 spray into both nostrils daily. 01/05/19 01/24/20  Nance Pear, MD  medroxyPROGESTERone (PROVERA) 10 MG tablet Take 1 tablet (10 mg total) by mouth daily for 7 days. 01/25/20 05/05/90  Copland, Deirdre Evener, PA-C    Allergies Penicillin g, Sulfa antibiotics, Morphine and related, and Penicillins  Family History  Problem Relation Age of Onset  . Hypertension Mother   .  Stroke Mother   . Hypertension Father   . Ovarian cancer Sister 31       1/2 sister on pat side  . Breast cancer Maternal Aunt 47    Social History Social History   Tobacco Use  . Smoking status: Never Smoker  . Smokeless tobacco: Never Used  Vaping Use  . Vaping Use: Never used  Substance Use Topics  . Alcohol use: Yes    Comment: socially  . Drug use: Never    Review of Systems  Constitutional: Negative for fever. Eyes: Negative for visual changes. ENT: Negative for sore throat. Neck: No neck pain  Cardiovascular: Negative for chest pain. Breast: L breast mass Respiratory: Negative for shortness of breath. Gastrointestinal: Negative for abdominal pain, vomiting or diarrhea. Genitourinary: Negative for dysuria. Musculoskeletal: Negative for back pain. Skin: Negative for rash. Neurological: Negative for headaches, weakness or numbness. Psych: No SI or HI  ____________________________________________   PHYSICAL EXAM:  VITAL SIGNS: ED Triage Vitals [03/17/20 2330]  Enc Vitals Group     BP (!) 168/88     Pulse Rate 72     Resp 16     Temp 99.2 F (37.3 C)     Temp Source Oral     SpO2 100 %     Weight 210 lb (95.3 kg)     Height '5\' 5"'  (1.651 m)     Head Circumference  Peak Flow      Pain Score 10     Pain Loc      Pain Edu?      Excl. in [place]?     Constitutional: Alert and oriented. Well appearing and in no apparent distress. HEENT:      Head: Normocephalic and atraumatic.         Eyes: Conjunctivae are normal. Sclera is non-icteric.       Mouth/Throat: Mucous membranes are moist.       Neck: Supple with no signs of meningismus. Cardiovascular: Regular rate and rhythm. No murmurs, gallops, or rubs.  Respiratory: Normal respiratory effort. Lungs are clear to auscultation bilaterally.  Breast: There is a 2 cm palpable mass on the left breast at 7 o'clock with no overlying erythema or warmth, very tender to palpation Gastrointestinal: Soft, non  tender. Musculoskeletal: No edema, cyanosis, or erythema of extremities. Neurologic: Normal speech and language. Face is symmetric. Moving all extremities. No gross focal neurologic deficits are appreciated. Skin: Skin is warm, dry and intact. No rash noted. Psychiatric: Mood and affect are normal. Speech and behavior are normal.  ____________________________________________   LABS (all labs ordered are listed, but only abnormal results are displayed)  Labs Reviewed  LACTIC ACID, PLASMA  CBC WITH DIFFERENTIAL/PLATELET  BASIC METABOLIC PANEL  LACTIC ACID, PLASMA   ____________________________________________  EKG  none  ____________________________________________  RADIOLOGY  Breast US: PND  ____________________________________________   PROCEDURES  Procedure(s) performed: None Procedures Critical Care performed:  None ____________________________________________   INITIAL IMPRESSION / ASSESSMENT AND PLAN / ED COURSE  44 y.o. female with a strong family history of ovarian and breast cancer, BRCA negative presents for evaluation of breast mass.  Patient has a 2 cm palpable mass at 7:00 on the left breast with no overlying erythema or warmth.  The mass very tender to palpation.  Differential diagnoses including infection versus malignancy versus benign cyst.  Ultrasound is pending.  Labs pending.  Patient was given IV fentanyl for pain.  Old medical records reviewed.  Care transferred to Dr. Jimmye Norman at 7 AM.      _____________________________________________ Please note:  Patient was evaluated in Emergency Department today for the symptoms described in the history of present illness. Patient was evaluated in the context of the global COVID-19 pandemic, which necessitated consideration that the patient might be at risk for infection with the SARS-CoV-2 virus that causes COVID-19. Institutional protocols and algorithms that pertain to the evaluation of patients at risk for  COVID-19 are in a state of rapid change based on information released by regulatory bodies including the CDC and federal and state organizations. These policies and algorithms were followed during the patient's care in the ED.  Some ED evaluations and interventions may be delayed as a result of limited staffing during the pandemic.   [place] Controlled Substance Database was reviewed by me. ____________________________________________   FINAL CLINICAL IMPRESSION(S) / ED DIAGNOSES   Final diagnoses:  Breast mass      NEW MEDICATIONS STARTED DURING THIS VISIT:  ED Discharge Orders    None       Note:  This document was prepared using Dragon voice recognition software and may include unintentional dictation errors.    Alfred Levins, [place], MD 03/18/20 [phone]

## 2020-03-20 ENCOUNTER — Telehealth: Payer: Self-pay | Admitting: Obstetrics and Gynecology

## 2020-03-20 NOTE — Telephone Encounter (Signed)
Pt aware of neg MyRisk results. IBIS=10.8%.   Patient understands these results only apply to her and her children, and this is not indicative of genetic testing results of her other family members. It is recommended that her other family members have genetic testing done.  Pt also understands negative genetic testing doesn't mean she will never get any of these cancers.   Hard copy mailed to pt. F/u prn.

## 2020-03-24 LAB — AEROBIC/ANAEROBIC CULTURE W GRAM STAIN (SURGICAL/DEEP WOUND)
Gram Stain: NONE SEEN
Special Requests: NORMAL

## 2020-03-25 NOTE — Progress Notes (Signed)
ED Antimicrobial Stewardship Positive Culture Follow Up   Meredith Harris is an 44 y.o. female who presented to [facility] on 03/18/2020 with a chief complaint of  Chief Complaint  Patient presents with  . Breast Mass    Recent Results (from the past 720 hour(s))  Aerobic/Anaerobic Culture (surgical/deep wound)     Status: None   Collection Time: 03/18/20  9:19 AM   Specimen: Abscess  Result Value Ref Range Status   Specimen Description   Final    ABSCESS Performed at [facility], [address], [place], [place] [address]    Special Requests   Final    Normal Performed at [facility], [address] [place], [place] [address]    Gram Stain NO WBC SEEN RARE GRAM POSITIVE COCCI IN PAIRS   Final   Culture   Final    FEW PROTEUS MIRABILIS NO ANAEROBES ISOLATED Performed at [facility], [address], [place], [place] [address]    Report Status 03/24/2020 FINAL  Final   Organism ID, Bacteria PROTEUS MIRABILIS  Final      Susceptibility   Proteus mirabilis - MIC*    AMPICILLIN <=2 SENSITIVE Sensitive     CEFAZOLIN 8 SENSITIVE Sensitive     CEFEPIME <=0.12 SENSITIVE Sensitive     CEFTAZIDIME <=1 SENSITIVE Sensitive     CEFTRIAXONE <=0.25 SENSITIVE Sensitive     CIPROFLOXACIN <=0.25 SENSITIVE Sensitive     GENTAMICIN <=1 SENSITIVE Sensitive     IMIPENEM 2 SENSITIVE Sensitive     TRIMETH/SULFA <=20 SENSITIVE Sensitive     AMPICILLIN/SULBACTAM <=2 SENSITIVE Sensitive     PIP/TAZO <=4 SENSITIVE Sensitive     * FEW PROTEUS MIRABILIS    []  Treated with Clindamycin, organism not covered by prescribed antimicrobial  Discussed with ED MD who actually saw the pt on the day of presentation to ED. Per MD "it was a small abscess and I& D may have taken care of it. Can we call and see if it is healed?"  ED Provider:  6/28 1700 Called patient's listed phone # [phone])and got voicemail. Left message to call back to [phone].   Emanuel Dowson A 03/25/2020, 8:20 PM Clinical Pharmacist

## 2020-03-27 NOTE — Consult Note (Signed)
2nd attempt to call patient about wound culture results. Left voicemail with a call back number. Will have pharmacist try again tomorrow. Please refer to previous pharmacist's note Baxter Hire) for plan when discussing with Dr. Mayford Knife.   Thanks,  Paschal Dopp, PharmD, BCPS.

## 2020-03-28 NOTE — Progress Notes (Signed)
ED Antimicrobial Stewardship Positive Culture Follow Up   Meredith Harris is an 44 y.o. female who presented to [facility] on 03/18/2020 with a chief complaint of  Chief Complaint  Patient presents with  . Breast Mass    Recent Results (from the past 720 hour(s))  Aerobic/Anaerobic Culture (surgical/deep wound)     Status: None   Collection Time: 03/18/20  9:19 AM   Specimen: Abscess  Result Value Ref Range Status   Specimen Description   Final    ABSCESS Performed at [facility], [address], [place], [place] [address]    Special Requests   Final    Normal Performed at [facility], [address] [place], [place] [address]    Gram Stain NO WBC SEEN RARE GRAM POSITIVE COCCI IN PAIRS   Final   Culture   Final    FEW PROTEUS MIRABILIS NO ANAEROBES ISOLATED Performed at [facility], [address] [place], [place] [address]    Report Status 03/24/2020 FINAL  Final   Organism ID, Bacteria PROTEUS MIRABILIS  Final      Susceptibility   Proteus mirabilis - MIC*    AMPICILLIN <=2 SENSITIVE Sensitive     CEFAZOLIN 8 SENSITIVE Sensitive     CEFEPIME <=0.12 SENSITIVE Sensitive     CEFTAZIDIME <=1 SENSITIVE Sensitive     CEFTRIAXONE <=0.25 SENSITIVE Sensitive     CIPROFLOXACIN <=0.25 SENSITIVE Sensitive     GENTAMICIN <=1 SENSITIVE Sensitive     IMIPENEM 2 SENSITIVE Sensitive     TRIMETH/SULFA <=20 SENSITIVE Sensitive     AMPICILLIN/SULBACTAM <=2 SENSITIVE Sensitive     PIP/TAZO <=4 SENSITIVE Sensitive     * FEW PROTEUS MIRABILIS    [x]  Treated with Clindamycin, organism resistant to prescribed antimicrobial  New antibiotic prescription: Ciprofloxacin 500mg  BID x 7 days   ED Provider: Dr.   Patient reports that area around I & D is still bothering her. After discussion with ED provider and new RX was called into [facility] on [address] on 7/1 @ 1315.   [facility] ,PHARMD, BCPS Clinical Pharmacist  03/28/2020, 1:30  PM

## 2020-04-19 ENCOUNTER — Other Ambulatory Visit: Payer: Self-pay | Admitting: General Surgery

## 2020-04-19 DIAGNOSIS — L732 Hidradenitis suppurativa: Secondary | ICD-10-CM

## 2020-05-20 ENCOUNTER — Encounter
Admission: RE | Admit: 2020-05-20 | Discharge: 2020-05-20 | Disposition: A | Discharge status: Home / Self Care | Payer: PRIVATE HEALTH INSURANCE | Source: Ambulatory Visit | Attending: General Surgery | Admitting: General Surgery

## 2020-05-20 ENCOUNTER — Other Ambulatory Visit: Payer: Self-pay

## 2020-05-20 NOTE — Patient Instructions (Signed)
Your procedure is scheduled on: 05/27/20 Report to [facility]. To find out your arrival time please call [phone] between 1PM - 3PM on 05/24/20.  Remember: Instructions that are not followed completely may result in serious medical risk, up to and including death, or upon the discretion of your surgeon and anesthesiologist your surgery may need to be rescheduled.     _X__ 1. Do not eat food after midnight the night before your procedure.                 No gum chewing or hard candies. You may drink clear liquids up to 2 hours                 before you are scheduled to arrive for your surgery- DO not drink clear                 liquids within 2 hours of the start of your surgery.                 Clear Liquids include:  water, apple juice without pulp, clear carbohydrate                 drink such as Clearfast or Gatorade, Black Coffee or Tea (Do not add                 anything to coffee or tea). Diabetics water only  __X__2.  On the morning of surgery brush your teeth with toothpaste and water, you                 may rinse your mouth with mouthwash if you wish.  Do not swallow any              toothpaste of mouthwash.     _X__ 3.  No Alcohol for 24 hours before or after surgery.   _X__ 4.  Do Not Smoke or use e-cigarettes For 24 Hours Prior to Your Surgery.                 Do not use any chewable tobacco products for at least 6 hours prior to                 surgery.  ____  5.  Bring all medications with you on the day of surgery if instructed.   __X__  6.  Notify your doctor if there is any change in your medical condition      (cold, fever, infections).     Do not wear jewelry, make-up, hairpins, clips or nail polish. Do not wear lotions, powders, or perfumes.  Do not shave 48 hours prior to surgery. Men may shave face and neck. Do not bring valuables to the hospital.    [facility] is not responsible for any belongings or  valuables.  Contacts, dentures/partials or body piercings may not be worn into surgery. Bring a case for your contacts, glasses or hearing aids, a denture cup will be supplied. Leave your suitcase in the car. After surgery it may be brought to your room. For patients admitted to the hospital, discharge time is determined by your treatment team.   Patients discharged the day of surgery will not be allowed to drive home.   Please read over the following fact sheets that you were given:   MRSA Information  __X__ Take these medicines the morning of surgery with A SIP OF WATER:  1. NONE  2.   3.   4.  5.  6.  ____ Fleet Enema (as directed)   __X__ Use CHG Soap/SAGE wipes as directed  __X__ Use inhalers on the day of surgery  ____ Stop metformin/Janumet/Farxiga 2 days prior to surgery    ____ Take 1/2 of usual insulin dose the night before surgery. No insulin the morning          of surgery.   ____ Stop Blood Thinners Coumadin/Plavix/Xarelto/Pleta/Pradaxa/Eliquis/Effient/Aspirin  on   Or contact your Surgeon, Cardiologist or Medical Doctor regarding  ability to stop your blood thinners  __X__ Stop Anti-inflammatories 7 days before surgery such as Advil, Ibuprofen, Motrin,  BC or Goodies Powder, Naprosyn, Naproxen, Aleve, Aspirin    __X__ Stop all herbal supplements, fish oil or vitamin E until after surgery.    ____ Bring C-Pap to the hospital.     How to Use Chlorhexidine for Bathing Chlorhexidine gluconate (CHG) is a germ-killing (antiseptic) solution that is used to clean the skin. It can get rid of the bacteria that normally live on the skin and can keep them away for about 24 hours. To clean your skin with CHG, you may be given:  A CHG solution to use in the shower or as part of a sponge bath.  A prepackaged cloth that contains CHG. Cleaning your skin with CHG may help lower the risk for infection:  While you are staying in the intensive care unit of the  hospital.  If you have a vascular access, such as a central line, to provide short-term or long-term access to your veins.  If you have a catheter to drain urine from your bladder.  If you are on a ventilator. A ventilator is a machine that helps you breathe by moving air in and out of your lungs.  After surgery. What are the risks? Risks of using CHG include:  A skin reaction.  Hearing loss, if CHG gets in your ears.  Eye injury, if CHG gets in your eyes and is not rinsed out.  The CHG product catching fire. Make sure that you avoid smoking and flames after applying CHG to your skin. Do not use CHG:  If you have a chlorhexidine allergy or have previously reacted to chlorhexidine.  On babies younger than 41 months of age. How to use CHG solution  Use CHG only as told by your health care provider, and follow the instructions on the label.  Use the full amount of CHG as directed. Usually, this is one bottle. During a shower Follow these steps when using CHG solution during a shower (unless your health care provider gives you different instructions): 1. Start the shower. 2. Use your normal soap and shampoo to wash your face and hair. 3. Turn off the shower or move out of the shower stream. 4. Pour the CHG onto a clean washcloth. Do not use any type of brush or rough-edged sponge. 5. Starting at your neck, lather your body down to your toes. Make sure you follow these instructions: ? If you will be having surgery, pay special attention to the part of your body where you will be having surgery. Scrub this area for at least 1 minute. ? Do not use CHG on your head or face. If the solution gets into your ears or eyes, rinse them well with water. ? Avoid your genital area. ? Avoid any areas of skin that have broken skin, cuts, or scrapes. ? Scrub your back and  under your arms. Make sure to wash skin folds. 6. Let the lather sit on your skin for 1-2 minutes or as long as told by your  health care provider. 7. Thoroughly rinse your entire body in the shower. Make sure that all body creases and crevices are rinsed well. 8. Dry off with a clean towel. Do not put any substances on your body afterward--such as powder, lotion, or perfume--unless you are told to do so by your health care provider. Only use lotions that are recommended by the manufacturer. 9. Put on clean clothes or pajamas. 10. If it is the night before your surgery, sleep in clean sheets. 11. Repeat the morning of surgery

## 2020-05-23 ENCOUNTER — Other Ambulatory Visit
Admission: RE | Admit: 2020-05-23 | Discharge: 2020-05-23 | Disposition: A | Discharge status: Home / Self Care | Payer: PRIVATE HEALTH INSURANCE | Source: Ambulatory Visit | Attending: General Surgery | Admitting: General Surgery

## 2020-05-23 ENCOUNTER — Other Ambulatory Visit: Payer: Self-pay

## 2020-05-23 DIAGNOSIS — Z20822 Contact with and (suspected) exposure to covid-19: Secondary | ICD-10-CM | POA: Insufficient documentation

## 2020-05-23 DIAGNOSIS — Z01812 Encounter for preprocedural laboratory examination: Secondary | ICD-10-CM | POA: Insufficient documentation

## 2020-05-23 LAB — SARS CORONAVIRUS 2 (TAT 6-24 HRS): SARS Coronavirus 2: NEGATIVE

## 2020-05-26 NOTE — H&P (Signed)
Patient ID: Meredith Harris is a 44 y.o. female.  HPI  The following portions of the patient's history were reviewed and updated as appropriate.  This an established patient is here today for: office visit. Patient is here to follow up from a left breast abscess and hidradenitis. She reports the breast abscess has healed up but there is a hard knot at the area. Patient also reports that she has been having right axillary drainage which started a couple of days ago.  Review of Systems  Constitutional: Negative for chills and fever.  Respiratory: Negative for cough.           Chief Complaint  Patient presents with  . Abscess    2 week f/u   . Hidradenitis     BP (!) 142/90   Pulse 73   Temp 36.5 C (97.7 F)   Ht 162.6 cm ('5\' 4"' )   Wt 93 kg (205 lb)   LMP 03/19/2020   SpO2 98%   BMI 35.19 kg/m       Past Medical History:  Diagnosis Date  . Asthma, unspecified asthma severity, unspecified whether complicated, unspecified whether persistent   . BRCA negative   . External hemorrhoids   . Irritable bowel syndrome   . Lyme disease   . Pregnancy    age of first pregnancy 28          Past Surgical History:  Procedure Laterality Date  . CHOLECYSTECTOMY  2008  . Hidradenitis left axilla  2020  . LAPAROSCOPIC TUBAL LIGATION               OB History    Gravida  4   Para  4   Term      Preterm      AB      Living        SAB      TAB      Ectopic      Molar      Multiple      Live Births          Obstetric Comments  Age at first period 57 Age of first pregnancy 18        Social History          Socioeconomic History  . Marital status: Single    Spouse name: Not on file  . Number of children: Not on file  . Years of education: Not on file  . Highest education level: Not on file  Occupational History  . Not on file  Tobacco Use  . Smoking status: Former Smoker    Types: Cigarettes    Quit date:  09/28/2014    Years since quitting: 5.5  . Smokeless tobacco: Never Used  Vaping Use  . Vaping Use: Never used  Substance and Sexual Activity  . Alcohol use: Yes    Comment: occasionally  . Drug use: Never  . Sexual activity: Defer  Other Topics Concern  . Not on file  Social History Narrative  . Not on file   Social Determinants of Health      Financial Resource Strain:   . Difficulty of Paying Living Expenses:   Food Insecurity:   . Worried About Charity fundraiser in the Last Year:   . Arboriculturist in the Last Year:   Transportation Needs:   . Film/video editor (Medical):   Marland Kitchen Lack of Transportation (Non-Medical):  Allergies  Allergen Reactions  . Penicillin Anaphylaxis  . Sulfa (Sulfonamide Antibiotics) Anaphylaxis  . Opioids - Morphine Analogues Other (See Comments)     restless legs    Current Medications        Current Outpatient Medications  Medication Sig Dispense Refill  . albuterol 90 mcg/actuation inhaler Inhale 2 inhalations into the lungs    . fluticasone propionate (FLONASE) 50 mcg/actuation nasal spray Place 2 sprays into both nostrils once daily    . ibuprofen (MOTRIN) 200 MG tablet Take 200 mg by mouth every 6 (six) hours as needed for Pain     No current facility-administered medications for this visit.           Family History  Problem Relation Age of Onset  . High blood pressure (Hypertension) Mother   . Stroke Mother   . High blood pressure (Hypertension) Father   . Breast cancer Maternal Aunt   . Colon cancer Neg Hx            Objective:   Physical Exam Exam conducted with a chaperone present.  Constitutional:      Appearance: Normal appearance.  Cardiovascular:     Rate and Rhythm: Normal rate and regular rhythm.     Pulses: Normal pulses.     Heart sounds: Normal heart sounds.  Pulmonary:     Effort: Pulmonary effort is normal.     Breath sounds: Normal breath sounds.   Chest:    Musculoskeletal:     Cervical back: Neck supple.  Skin:    General: Skin is warm and dry.  Neurological:     Mental Status: She is alert and oriented to person, place, and time.  Psychiatric:        Mood and Affect: Mood normal.        Behavior: Behavior normal.     Labs and Radiology:   Culture from March 18, 2020 at times of the incision and drainage of the left chest wall at the gram-positive cocci in pairs (rare), few Proteus mirabilis.  The latter was sensitive to all.    Assessment:     Hidradenitis.    Plan:     The patient is a candidate for surgical excision of the areas of most active inflammation including the right axilla and the left lower chest wall.  She will be placed on doxycycline, 100 mg p.o. twice daily starting 1 week prior to surgery.  She will make use of warm compresses in the interim for comfort.  She started a new job, but I would expect her be able to return to work 3-5 days after surgery.  Surgery as scheduled.   A prescription has been called in to [facility] today for doxycycline 100 mg one po BID starting on week prior to surgery no refills.   Patient to follow up as scheduled and is aware to call for any new issues or concerns.     Entered by Ledell Noss, CMA, acting as a scribe for Dr. Hervey Ard, MD.   The documentation recorded by the scribe accurately reflects the service I personally performed and the decisions made by me.   Robert Bellow, MD FACS

## 2020-05-27 ENCOUNTER — Ambulatory Visit
Admission: RE | Admit: 2020-05-27 | Discharge: 2020-05-27 | Disposition: A | Discharge status: Home / Self Care | Payer: PRIVATE HEALTH INSURANCE | Attending: General Surgery | Admitting: General Surgery

## 2020-05-27 ENCOUNTER — Ambulatory Visit: Payer: PRIVATE HEALTH INSURANCE | Admitting: Certified Registered"

## 2020-05-27 ENCOUNTER — Encounter: Payer: Self-pay | Admitting: General Surgery

## 2020-05-27 ENCOUNTER — Other Ambulatory Visit: Payer: Self-pay

## 2020-05-27 ENCOUNTER — Encounter
Admission: RE | Disposition: A | Discharge status: Home / Self Care | Payer: Self-pay | Source: Home / Self Care | Attending: General Surgery

## 2020-05-27 DIAGNOSIS — K219 Gastro-esophageal reflux disease without esophagitis: Secondary | ICD-10-CM | POA: Diagnosis not present

## 2020-05-27 DIAGNOSIS — Z79899 Other long term (current) drug therapy: Secondary | ICD-10-CM | POA: Diagnosis not present

## 2020-05-27 DIAGNOSIS — L02213 Cutaneous abscess of chest wall: Secondary | ICD-10-CM | POA: Diagnosis not present

## 2020-05-27 DIAGNOSIS — K589 Irritable bowel syndrome without diarrhea: Secondary | ICD-10-CM | POA: Insufficient documentation

## 2020-05-27 DIAGNOSIS — N611 Abscess of the breast and nipple: Secondary | ICD-10-CM | POA: Diagnosis not present

## 2020-05-27 DIAGNOSIS — Z882 Allergy status to sulfonamides status: Secondary | ICD-10-CM | POA: Diagnosis not present

## 2020-05-27 DIAGNOSIS — L732 Hidradenitis suppurativa: Secondary | ICD-10-CM | POA: Insufficient documentation

## 2020-05-27 DIAGNOSIS — Z885 Allergy status to narcotic agent status: Secondary | ICD-10-CM | POA: Insufficient documentation

## 2020-05-27 DIAGNOSIS — Z880 Allergy status to penicillin: Secondary | ICD-10-CM | POA: Diagnosis not present

## 2020-05-27 DIAGNOSIS — Z8041 Family history of malignant neoplasm of ovary: Secondary | ICD-10-CM | POA: Diagnosis not present

## 2020-05-27 DIAGNOSIS — Z87891 Personal history of nicotine dependence: Secondary | ICD-10-CM | POA: Diagnosis not present

## 2020-05-27 DIAGNOSIS — Z8249 Family history of ischemic heart disease and other diseases of the circulatory system: Secondary | ICD-10-CM | POA: Insufficient documentation

## 2020-05-27 DIAGNOSIS — J45909 Unspecified asthma, uncomplicated: Secondary | ICD-10-CM | POA: Insufficient documentation

## 2020-05-27 DIAGNOSIS — Z791 Long term (current) use of non-steroidal anti-inflammatories (NSAID): Secondary | ICD-10-CM | POA: Diagnosis not present

## 2020-05-27 DIAGNOSIS — Z803 Family history of malignant neoplasm of breast: Secondary | ICD-10-CM | POA: Insufficient documentation

## 2020-05-27 DIAGNOSIS — Z87898 Personal history of other specified conditions: Secondary | ICD-10-CM | POA: Diagnosis not present

## 2020-05-27 DIAGNOSIS — Z9049 Acquired absence of other specified parts of digestive tract: Secondary | ICD-10-CM | POA: Insufficient documentation

## 2020-05-27 HISTORY — PX: WOUND DEBRIDEMENT: SHX247

## 2020-05-27 LAB — POCT PREGNANCY, URINE: Preg Test, Ur: NEGATIVE

## 2020-05-27 SURGERY — DEBRIDEMENT, WOUND
Anesthesia: General | Laterality: Bilateral

## 2020-05-27 MED ORDER — FAMOTIDINE 20 MG PO TABS
ORAL_TABLET | ORAL | Status: AC
  Administered 2020-05-27: 20 mg via ORAL
  Filled 2020-05-27: qty 1

## 2020-05-27 MED ORDER — DEXAMETHASONE SODIUM PHOSPHATE 10 MG/ML IJ SOLN
INTRAMUSCULAR | Status: AC
  Filled 2020-05-27: qty 4

## 2020-05-27 MED ORDER — ROCURONIUM BROMIDE 10 MG/ML (PF) SYRINGE
PREFILLED_SYRINGE | INTRAVENOUS | Status: AC
  Filled 2020-05-27: qty 30

## 2020-05-27 MED ORDER — CLINDAMYCIN PHOSPHATE 900 MG/50ML IV SOLN
INTRAVENOUS | Status: AC
  Filled 2020-05-27: qty 50

## 2020-05-27 MED ORDER — LIDOCAINE-EPINEPHRINE (PF) 1 %-1:200000 IJ SOLN
INTRAMUSCULAR | Status: AC
  Filled 2020-05-27: qty 30

## 2020-05-27 MED ORDER — FENTANYL CITRATE (PF) 100 MCG/2ML IJ SOLN: 25.0000 ug | INTRAMUSCULAR | Status: DC | PRN

## 2020-05-27 MED ORDER — FENTANYL CITRATE (PF) 100 MCG/2ML IJ SOLN
INTRAMUSCULAR | Status: AC
  Filled 2020-05-27: qty 2

## 2020-05-27 MED ORDER — OXYCODONE HCL 5 MG/5ML PO SOLN: 5.0000 mg | Freq: Once | ORAL | Status: DC | PRN

## 2020-05-27 MED ORDER — LIDOCAINE-EPINEPHRINE 1 %-1:100000 IJ SOLN
INTRAMUSCULAR | Status: AC
  Filled 2020-05-27: qty 2

## 2020-05-27 MED ORDER — ACETAMINOPHEN 10 MG/ML IV SOLN: 1000.0000 mg | Freq: Once | INTRAVENOUS | Status: DC | PRN

## 2020-05-27 MED ORDER — OXYCODONE HCL 5 MG PO TABS: 5.0000 mg | ORAL_TABLET | Freq: Once | ORAL | Status: DC | PRN

## 2020-05-27 MED ORDER — HYDROCODONE-ACETAMINOPHEN 5-325 MG PO TABS
ORAL_TABLET | ORAL | Status: AC
  Filled 2020-05-27: qty 1

## 2020-05-27 MED ORDER — GLYCOPYRROLATE 0.2 MG/ML IJ SOLN
INTRAMUSCULAR | Status: AC
  Filled 2020-05-27: qty 5

## 2020-05-27 MED ORDER — ACETAMINOPHEN 10 MG/ML IV SOLN
INTRAVENOUS | Status: AC
  Filled 2020-05-27: qty 100

## 2020-05-27 MED ORDER — HYDROCODONE-ACETAMINOPHEN 5-325 MG PO TABS
1.0000 | ORAL_TABLET | Freq: Once | ORAL | Status: AC
  Administered 2020-05-27: 1 via ORAL

## 2020-05-27 MED ORDER — EPHEDRINE SULFATE 50 MG/ML IJ SOLN
INTRAMUSCULAR | Status: DC | PRN
  Administered 2020-05-27: 5 mg via INTRAVENOUS

## 2020-05-27 MED ORDER — SCOPOLAMINE 1 MG/3DAYS TD PT72
MEDICATED_PATCH | TRANSDERMAL | Status: AC
  Filled 2020-05-27: qty 1

## 2020-05-27 MED ORDER — LIDOCAINE HCL (CARDIAC) PF 100 MG/5ML IV SOSY
PREFILLED_SYRINGE | INTRAVENOUS | Status: DC | PRN
  Administered 2020-05-27: 100 mg via INTRAVENOUS

## 2020-05-27 MED ORDER — CHLORHEXIDINE GLUCONATE 0.12 % MT SOLN: 15.0000 mL | Freq: Once | OROMUCOSAL | Status: AC

## 2020-05-27 MED ORDER — SUCCINYLCHOLINE CHLORIDE 200 MG/10ML IV SOSY
PREFILLED_SYRINGE | INTRAVENOUS | Status: AC
  Filled 2020-05-27: qty 30

## 2020-05-27 MED ORDER — FENTANYL CITRATE (PF) 100 MCG/2ML IJ SOLN
INTRAMUSCULAR | Status: DC | PRN
Start: 2020-05-27 — End: 2020-05-27
  Administered 2020-05-27 (×3): 50 ug via INTRAVENOUS

## 2020-05-27 MED ORDER — PROPOFOL 10 MG/ML IV BOLUS
INTRAVENOUS | Status: DC | PRN
  Administered 2020-05-27: 150 mg via INTRAVENOUS

## 2020-05-27 MED ORDER — PHENYLEPHRINE HCL (PRESSORS) 10 MG/ML IV SOLN
INTRAVENOUS | Status: AC
  Filled 2020-05-27: qty 1

## 2020-05-27 MED ORDER — LIDOCAINE HCL (PF) 2 % IJ SOLN
INTRAMUSCULAR | Status: AC
  Filled 2020-05-27: qty 20

## 2020-05-27 MED ORDER — LACTATED RINGERS IV SOLN: INTRAVENOUS | Status: DC

## 2020-05-27 MED ORDER — ROCURONIUM BROMIDE 100 MG/10ML IV SOLN
INTRAVENOUS | Status: DC | PRN
  Administered 2020-05-27: 10 mg via INTRAVENOUS
  Administered 2020-05-27: 5 mg via INTRAVENOUS
  Administered 2020-05-27: 40 mg via INTRAVENOUS

## 2020-05-27 MED ORDER — ACETAMINOPHEN 10 MG/ML IV SOLN
INTRAVENOUS | Status: DC | PRN
  Administered 2020-05-27: 1000 mg via INTRAVENOUS

## 2020-05-27 MED ORDER — MIDAZOLAM HCL 2 MG/2ML IJ SOLN
INTRAMUSCULAR | Status: DC | PRN
  Administered 2020-05-27: 2 mg via INTRAVENOUS

## 2020-05-27 MED ORDER — SUCCINYLCHOLINE CHLORIDE 20 MG/ML IJ SOLN
INTRAMUSCULAR | Status: DC | PRN
  Administered 2020-05-27: 100 mg via INTRAVENOUS

## 2020-05-27 MED ORDER — KETOROLAC TROMETHAMINE 30 MG/ML IJ SOLN
INTRAMUSCULAR | Status: AC
  Filled 2020-05-27: qty 2

## 2020-05-27 MED ORDER — CHLORHEXIDINE GLUCONATE 0.12 % MT SOLN
OROMUCOSAL | Status: AC
  Administered 2020-05-27: 15 mL via OROMUCOSAL
  Filled 2020-05-27: qty 15

## 2020-05-27 MED ORDER — SUGAMMADEX SODIUM 200 MG/2ML IV SOLN
INTRAVENOUS | Status: DC | PRN
  Administered 2020-05-27: 300 mg via INTRAVENOUS

## 2020-05-27 MED ORDER — DEXAMETHASONE SODIUM PHOSPHATE 10 MG/ML IJ SOLN
INTRAMUSCULAR | Status: DC | PRN
  Administered 2020-05-27: 10 mg via INTRAVENOUS

## 2020-05-27 MED ORDER — BUPIVACAINE-EPINEPHRINE (PF) 0.5% -1:200000 IJ SOLN
INTRAMUSCULAR | Status: AC
  Filled 2020-05-27: qty 30

## 2020-05-27 MED ORDER — ONDANSETRON HCL 4 MG/2ML IJ SOLN: 4.0000 mg | Freq: Once | INTRAMUSCULAR | Status: DC | PRN

## 2020-05-27 MED ORDER — BUPIVACAINE HCL (PF) 0.5 % IJ SOLN
INTRAMUSCULAR | Status: AC
  Filled 2020-05-27: qty 30

## 2020-05-27 MED ORDER — MIDAZOLAM HCL 2 MG/2ML IJ SOLN
INTRAMUSCULAR | Status: AC
  Filled 2020-05-27: qty 2

## 2020-05-27 MED ORDER — FAMOTIDINE 20 MG PO TABS: 20.0000 mg | ORAL_TABLET | Freq: Once | ORAL | Status: AC

## 2020-05-27 MED ORDER — BUPIVACAINE-EPINEPHRINE (PF) 0.5% -1:200000 IJ SOLN
INTRAMUSCULAR | Status: DC | PRN
  Administered 2020-05-27: 25 mL

## 2020-05-27 MED ORDER — DEXMEDETOMIDINE HCL IN NACL 80 MCG/20ML IV SOLN
INTRAVENOUS | Status: AC
  Filled 2020-05-27: qty 40

## 2020-05-27 MED ORDER — GLYCOPYRROLATE 0.2 MG/ML IJ SOLN
INTRAMUSCULAR | Status: DC | PRN
  Administered 2020-05-27: .2 mg via INTRAVENOUS

## 2020-05-27 MED ORDER — ONDANSETRON HCL 4 MG/2ML IJ SOLN
INTRAMUSCULAR | Status: DC | PRN
  Administered 2020-05-27 (×2): 4 mg via INTRAVENOUS

## 2020-05-27 MED ORDER — ORAL CARE MOUTH RINSE: 15.0000 mL | Freq: Once | OROMUCOSAL | Status: AC

## 2020-05-27 MED ORDER — ONDANSETRON HCL 4 MG/2ML IJ SOLN
INTRAMUSCULAR | Status: AC
  Filled 2020-05-27: qty 16

## 2020-05-27 MED ORDER — CLINDAMYCIN PHOSPHATE 900 MG/50ML IV SOLN
900.0000 mg | INTRAVENOUS | Status: AC
  Administered 2020-05-27: 900 mg via INTRAVENOUS

## 2020-05-27 MED ORDER — SODIUM CHLORIDE FLUSH 0.9 % IV SOLN
INTRAVENOUS | Status: AC
  Filled 2020-05-27: qty 10

## 2020-05-27 MED ORDER — HYDROCODONE-ACETAMINOPHEN 5-325 MG PO TABS
1.0000 | ORAL_TABLET | ORAL | 0 refills | Status: DC | PRN
Start: 2020-05-27 — End: 2021-01-27

## 2020-05-27 MED ORDER — DEXMEDETOMIDINE HCL IN NACL 200 MCG/50ML IV SOLN
INTRAVENOUS | Status: DC | PRN
  Administered 2020-05-27: 8 ug via INTRAVENOUS

## 2020-05-27 MED ORDER — LIDOCAINE-EPINEPHRINE (PF) 1 %-1:200000 IJ SOLN
INTRAMUSCULAR | Status: DC | PRN
  Administered 2020-05-27: 25 mL

## 2020-05-27 SURGICAL SUPPLY — 38 items
APL PRP STRL LF DISP 70% ISPRP (MISCELLANEOUS) ×1
CANISTER SUCT 1200ML W/VALVE (MISCELLANEOUS) ×3 IMPLANT
CHLORAPREP W/TINT 26 (MISCELLANEOUS) ×3 IMPLANT
CLOSURE WOUND 1/2 X4 (GAUZE/BANDAGES/DRESSINGS)
COVER WAND RF STERILE (DRAPES) ×3 IMPLANT
DRAPE LAPAROTOMY TRNSV 106X77 (MISCELLANEOUS) ×3 IMPLANT
DRSG TEGADERM 2-3/8X2-3/4 SM (GAUZE/BANDAGES/DRESSINGS) ×2 IMPLANT
DRSG TEGADERM 4X4.75 (GAUZE/BANDAGES/DRESSINGS) ×3 IMPLANT
DRSG TELFA 4X3 1S NADH ST (GAUZE/BANDAGES/DRESSINGS) ×3 IMPLANT
ELECT REM PT RETURN 9FT ADLT (ELECTROSURGICAL) ×3
ELECTRODE REM PT RTRN 9FT ADLT (ELECTROSURGICAL) ×1 IMPLANT
GLOVE BIO SURGEON STRL SZ7.5 (GLOVE) ×3 IMPLANT
GLOVE INDICATOR 8.0 STRL GRN (GLOVE) ×3 IMPLANT
GOWN STRL REUS W/ TWL LRG LVL3 (GOWN DISPOSABLE) ×2 IMPLANT
GOWN STRL REUS W/TWL LRG LVL3 (GOWN DISPOSABLE) ×6
KIT TURNOVER KIT A (KITS) ×3 IMPLANT
LABEL OR SOLS (LABEL) ×3 IMPLANT
NDL HYPO 25X1 1.5 SAFETY (NEEDLE) ×1 IMPLANT
NEEDLE HYPO 25X1 1.5 SAFETY (NEEDLE) ×3 IMPLANT
NS IRRIG 500ML POUR BTL (IV SOLUTION) ×3 IMPLANT
PACK BASIN MINOR (MISCELLANEOUS) ×3 IMPLANT
SHEARS HARMONIC 9CM CVD (BLADE) IMPLANT
STAPLER SKIN PROX 35W (STAPLE) ×2 IMPLANT
STRIP CLOSURE SKIN 1/2X4 (GAUZE/BANDAGES/DRESSINGS) ×1 IMPLANT
SUT ETHILON 4-0 (SUTURE) ×3
SUT ETHILON 4-0 FS2 18XMFL BLK (SUTURE) ×1
SUT VIC AB 2-0 CT1 27 (SUTURE) ×12
SUT VIC AB 2-0 CT1 TAPERPNT 27 (SUTURE) ×1 IMPLANT
SUT VIC AB 3-0 54X BRD REEL (SUTURE) ×1 IMPLANT
SUT VIC AB 3-0 BRD 54 (SUTURE) ×3
SUT VIC AB 3-0 SH 27 (SUTURE) ×3
SUT VIC AB 3-0 SH 27X BRD (SUTURE) IMPLANT
SUT VIC AB 4-0 PS2 18 (SUTURE) ×3 IMPLANT
SUTURE ETHLN 4-0 FS2 18XMF BLK (SUTURE) IMPLANT
SWABSTK COMLB BENZOIN TINCTURE (MISCELLANEOUS) ×3 IMPLANT
SYR CONTROL 10ML LL (SYRINGE) ×3 IMPLANT
TAPE TRANSPORE STRL 2 31045 (GAUZE/BANDAGES/DRESSINGS) ×2 IMPLANT
TOWEL OR 17X26 4PK STRL BLUE (TOWEL DISPOSABLE) ×2 IMPLANT

## 2020-05-27 NOTE — Discharge Instructions (Signed)

## 2020-05-27 NOTE — Op Note (Addendum)
Preoperative diagnosis:  1) hidradenitis of the right axilla;  2) left chest wall abscess.  Postoperative diagnosis: Same.  Operative procedure: 1) excision right axillary hidradenitis; 2 (excision left chest wall abscess.  Operating surgeon: Donnalee Curry, MD.  Anesthesia: General endotracheal, 0.5% Xylocaine with 0.25% Marcaine with 1: 200,000 units of epinephrine, 50 cc.  Estimated blood loss: Less than 10 cc.  Clinical note: This 44 year old woman has had intermittent episodes of pain and swelling and abscess formation in the right axilla.  Exam is consistent with hidradenitis.  Presently quiesced sent.  She recently had an abscess on the left chest wall at the inferior aspect of the breast along the inframammary fold.  This is resolved nicely with a residual pea-sized thickening.  She is felt to be a candidate for excision of both areas.  Due to her medical allergies she received clindamycin intravenously on induction of anesthesia.  SCD stockings for DVT prevention.  Operative note: The breast on the right side was taped inferior medially and on the left side superiorly to allow better exposure of the areas of concern.  With wound healed she had both areas cleansed with ChloraPrep and draped.  The LEFT chest wall lesion was approached through an elliptical incision. This was a 1.5 x 3 cm area excised and closed.  This was excised completed with sharp dissection and a small area of granulation tissue debrided with a curette.  With good hemostasis the wound was closed in layers with interrupted 3-0 Vicryl figure-of-eight sutures followed by interrupted 4-0 nylon horizontal mattress sutures.  Telfa and Tegaderm dressing applied.  Attention was then turned to the right axilla.  Here the area of chronic inflammation was outlined in a curvilinear fashion.  The area was infiltrated with local anesthesia and had been completed on the left side prior to skin incision.  The skin was incised sharply  and the remaining dissection completed with electrocautery.  All the inflammatory tissue was removed.  Hemostasis was electrocautery. The defect was 3 cm in diameter and 15 cm in length.  Fairly thick flaps were elevated circumferentially for no more than a centimeter.  This allowed easy approximation of the deep tissue with 2 layers of 2-0 Vicryl figure-of-eight sutures.  With the deep dermal sutures of interrupted 2-0 Vicryl sutures placed the skin was approximated with staples.  Telfa and Tegaderm dressing applied.  The patient tolerated the procedure well and was taken to recovery in stable condition.

## 2020-05-27 NOTE — Transfer of Care (Signed)
Immediate Anesthesia Transfer of Care Note  Patient: Meredith Harris  Procedure(s) Performed: DEBRIDEMENT WOUND (Bilateral )  Patient Location: PACU  Anesthesia Type:General  Level of Consciousness: awake, drowsy and patient cooperative  Airway & Oxygen Therapy: Patient Spontanous Breathing and Patient connected to face mask oxygen  Post-op Assessment: Report given to RN and Post -op Vital signs reviewed and stable  Post vital signs: Reviewed and stable  Last Vitals:  Vitals Value Taken Time  BP 156/88 05/27/20 1439  Temp 36 C 05/27/20 1439  Pulse 93 05/27/20 1445  Resp 18 05/27/20 1445  SpO2 100 % 05/27/20 1445  Vitals shown include unvalidated device data.  Last Pain:  Vitals:   05/27/20 1052  TempSrc: Tympanic  PainSc: 0-No pain         Complications: No complications documented.

## 2020-05-27 NOTE — Anesthesia Procedure Notes (Signed)
Procedure Name: Intubation Performed by: Mohammed Kindle, CRNA Pre-anesthesia Checklist: Patient identified, Emergency Drugs available, Suction available and Patient being monitored Patient Re-evaluated:Patient Re-evaluated prior to induction Oxygen Delivery Method: Circle system utilized Preoxygenation: Pre-oxygenation with 100% oxygen Induction Type: IV induction Ventilation: Mask ventilation without difficulty Tube type: Oral Tube size: 6.5 mm Number of attempts: 1 Airway Equipment and Method: Stylet and Oral airway Placement Confirmation: ETT inserted through vocal cords under direct vision,  positive ETCO2,  breath sounds checked- equal and bilateral and CO2 detector Secured at: 20 cm Tube secured with: Tape Dental Injury: Teeth and Oropharynx as per pre-operative assessment

## 2020-05-27 NOTE — Anesthesia Preprocedure Evaluation (Signed)
Anesthesia Evaluation  Patient identified by MRN, date of birth, ID band Patient awake    Reviewed: Allergy & Precautions, NPO status , Patient's Chart, lab work & pertinent test results  History of Anesthesia Complications Negative for: history of anesthetic complications  Airway Mallampati: II  TM Distance: >3 FB Neck ROM: Full    Dental  (+) Teeth Intact, Caps, Dental Advisory Given   Pulmonary asthma , neg sleep apnea, neg COPD, Patient abstained from smoking.Not current smoker,  Inhalers once every few months. Never hospitalized for asthma   Pulmonary exam normal breath sounds clear to auscultation       Cardiovascular Exercise Tolerance: Good METS(-) hypertension(-) CAD and (-) Past MI negative cardio ROS  (-) dysrhythmias  Rhythm:Regular Rate:Normal - Systolic murmurs    Neuro/Psych negative neurological ROS  negative psych ROS   GI/Hepatic GERD  Poorly Controlled,(+)     (-) substance abuse  ,   Endo/Other  neg diabetes  Renal/GU negative Renal ROS     Musculoskeletal   Abdominal   Peds  Hematology   Anesthesia Other Findings Past Medical History: No date: Asthma 01/2020: BRCA negative     Comment:  MyRisk neg; IBIS=10.8% No date: External hemorrhoid No date: Family history of breast cancer No date: Family history of ovarian cancer No date: IBS (irritable bowel syndrome) No date: Lyme disease  Reproductive/Obstetrics                             Anesthesia Physical Anesthesia Plan  ASA: II  Anesthesia Plan: General   Post-op Pain Management:    Induction: Intravenous and Rapid sequence  PONV Risk Score and Plan: 3 and Ondansetron, Dexamethasone and Midazolam  Airway Management Planned: Oral ETT  Additional Equipment: None  Intra-op Plan:   Post-operative Plan: Extubation in OR  Informed Consent: I have reviewed the patients History and Physical, chart, labs  and discussed the procedure including the risks, benefits and alternatives for the proposed anesthesia with the patient or authorized representative who has indicated his/her understanding and acceptance.     Dental advisory given  Plan Discussed with: CRNA and Surgeon  Anesthesia Plan Comments: (Discussed risks of anesthesia with patient, including PONV, sore throat, lip/dental damage. Rare risks discussed as well, such as aspiration,  cardiorespiratory and neurological sequelae. Patient understands.)        Anesthesia Quick Evaluation

## 2020-05-27 NOTE — Anesthesia Postprocedure Evaluation (Signed)
Anesthesia Post Note  Patient: Meredith Harris  Procedure(s) Performed: DEBRIDEMENT WOUND (Bilateral )  Patient location during evaluation: PACU Anesthesia Type: General Level of consciousness: awake and alert Pain management: pain level controlled Vital Signs Assessment: post-procedure vital signs reviewed and stable Respiratory status: spontaneous breathing, nonlabored ventilation, respiratory function stable and patient connected to nasal cannula oxygen Cardiovascular status: blood pressure returned to baseline and stable Postop Assessment: no apparent nausea or vomiting Anesthetic complications: no   No complications documented.   Last Vitals:  Vitals:   05/27/20 1516 05/27/20 1523  BP: 139/85   Pulse:  77  Resp: 18   Temp:    SpO2: 99%     Last Pain:  Vitals:   05/27/20 1523  TempSrc:   PainSc: 4                  Corinda Gubler

## 2020-05-27 NOTE — OR Nursing (Signed)
Verbal from OR room ([place] for Dr. Lemar Livings) - may discharge pt to home without his visit to postop.

## 2020-05-27 NOTE — H&P (Signed)
Meredith Harris [id number] 1975/10/15     HPI:  Patient with hidradenitis of the right axilla and a past history of left breast abscess.  For debridement/ excision.   Medications Prior to Admission  Medication Sig Dispense Refill Last Dose  . albuterol (PROVENTIL HFA;VENTOLIN HFA) 108 (90 Base) MCG/ACT inhaler Inhale 2 puffs into the lungs every 6 (six) hours as needed for wheezing or shortness of breath. 1 Inhaler 0 Past Month at Unknown time  . doxycycline (DORYX) 100 MG EC tablet Take 100 mg by mouth 2 (two) times daily. Unsure of dosage still taking   05/26/2020 at Unknown time  . ibuprofen (ADVIL) 200 MG tablet Take 400 mg by mouth every 6 (six) hours as needed for moderate pain.   Past Month at Unknown time   Allergies  Allergen Reactions  . Penicillin G Anaphylaxis  . Sulfa Antibiotics Anaphylaxis  . Morphine And Related Other (See Comments)    Restless legs  . Penicillins Hives, Itching and Swelling    Has patient had a PCN reaction causing immediate rash, facial/tongue/throat swelling, SOB or lightheadedness with hypotension: Yes Has patient had a PCN reaction causing severe rash involving mucus membranes or skin necrosis: Unknown Has patient had a PCN reaction that required hospitalization: Unknown Has patient had a PCN reaction occurring within the last 10 years: Unknown If all of the above answers are "NO", then may proceed with Cephalosporin use.    Past Medical History:  Diagnosis Date  . Asthma   . BRCA negative 01/2020   MyRisk neg; IBIS=10.8%  . External hemorrhoid   . Family history of breast cancer   . Family history of ovarian cancer   . IBS (irritable bowel syndrome)   . Lyme disease    Past Surgical History:  Procedure Laterality Date  . CHOLECYSTECTOMY    . TUBAL LIGATION     Social History   Socioeconomic History  . Marital status: Single    Spouse name: Not on file  . Number of children: Not on file  . Years of education: Not on file  . Highest  education level: Not on file  Occupational History  . Not on file  Tobacco Use  . Smoking status: Never Smoker  . Smokeless tobacco: Never Used  Vaping Use  . Vaping Use: Never used  Substance and Sexual Activity  . Alcohol use: Yes    Comment: socially  . Drug use: Never  . Sexual activity: Yes    Partners: Male    Birth control/protection: None  Other Topics Concern  . Not on file  Social History Narrative  . Not on file   Social Determinants of Health   Financial Resource Strain:   . Difficulty of Paying Living Expenses: Not on file  Food Insecurity:   . Worried About Charity fundraiser in the Last Year: Not on file  . Ran Out of Food in the Last Year: Not on file  Transportation Needs:   . Lack of Transportation (Medical): Not on file  . Lack of Transportation (Non-Medical): Not on file  Physical Activity:   . Days of Exercise per Week: Not on file  . Minutes of Exercise per Session: Not on file  Stress:   . Feeling of Stress : Not on file  Social Connections:   . Frequency of Communication with Friends and Family: Not on file  . Frequency of Social Gatherings with Friends and Family: Not on file  . Attends Religious Services: Not  on file  . Active Member of Clubs or Organizations: Not on file  . Attends Archivist Meetings: Not on file  . Marital Status: Not on file  Intimate Partner Violence:   . Fear of Current or Ex-Partner: Not on file  . Emotionally Abused: Not on file  . Physically Abused: Not on file  . Sexually Abused: Not on file   Social History   Social History Narrative  . Not on file     ROS: Negative.     PE: HEENT: Negative. Lungs: Clear. Cardio: RR.   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    [facility] 05/27/2020

## 2020-05-27 NOTE — Progress Notes (Addendum)
Patient called with complaints of Left chest pain "directly over my heart", 6/10.   Initially states no change to pain with movement or inspiration.  BP 143/91, HR 50.  Denies SOB or Nausea.  Dr. Suzan Slick notified, at bedside to assess.  Patient does complain of increase to pain with palpation of chest.  No further orders from Dr. Suzan Slick.

## 2020-05-27 NOTE — Progress Notes (Signed)
Evaluated patient for complaints of left sided chest pain, non-radiating, with tenderness to palpation and worse with deep inspiration. Able to climb 2 flights of stairs normally, no major cardiac risk factors. Says she had a stress test 5 years ago for chest pain and this did not show anything worrisome (per patient).  No need for further workup, likely MSK related. Can proceed with anesthetic.

## 2020-05-29 LAB — SURGICAL PATHOLOGY

## 2020-06-01 ENCOUNTER — Encounter: Payer: Self-pay | Admitting: Emergency Medicine

## 2020-06-01 ENCOUNTER — Other Ambulatory Visit: Payer: Self-pay

## 2020-06-01 ENCOUNTER — Emergency Department
Admission: EM | Admit: 2020-06-01 | Discharge: 2020-06-02 | Disposition: A | Discharge status: Home / Self Care | Payer: BLUE CROSS/BLUE SHIELD | Attending: Emergency Medicine | Admitting: Emergency Medicine

## 2020-06-01 DIAGNOSIS — Z5321 Procedure and treatment not carried out due to patient leaving prior to being seen by health care provider: Secondary | ICD-10-CM | POA: Diagnosis not present

## 2020-06-01 DIAGNOSIS — R519 Headache, unspecified: Secondary | ICD-10-CM | POA: Insufficient documentation

## 2020-06-01 DIAGNOSIS — M79661 Pain in right lower leg: Secondary | ICD-10-CM | POA: Insufficient documentation

## 2020-06-01 DIAGNOSIS — R0989 Other specified symptoms and signs involving the circulatory and respiratory systems: Secondary | ICD-10-CM | POA: Insufficient documentation

## 2020-06-01 LAB — CBC WITH DIFFERENTIAL/PLATELET
Abs Immature Granulocytes: 0.02 10*3/uL (ref 0.00–0.07)
Basophils Absolute: 0.1 10*3/uL (ref 0.0–0.1)
Basophils Relative: 1 %
Eosinophils Absolute: 0.4 10*3/uL (ref 0.0–0.5)
Eosinophils Relative: 5 %
HCT: 36.3 % (ref 36.0–46.0)
Hemoglobin: 12.8 g/dL (ref 12.0–15.0)
Immature Granulocytes: 0 %
Lymphocytes Relative: 32 %
Lymphs Abs: 2.6 10*3/uL (ref 0.7–4.0)
MCH: 30 pg (ref 26.0–34.0)
MCHC: 35.3 g/dL (ref 30.0–36.0)
MCV: 85.2 fL (ref 80.0–100.0)
Monocytes Absolute: 0.4 10*3/uL (ref 0.1–1.0)
Monocytes Relative: 4 %
Neutro Abs: 4.7 10*3/uL (ref 1.7–7.7)
Neutrophils Relative %: 58 %
Platelets: 329 10*3/uL (ref 150–400)
RBC: 4.26 MIL/uL (ref 3.87–5.11)
RDW: 13.6 % (ref 11.5–15.5)
WBC: 8.2 10*3/uL (ref 4.0–10.5)
nRBC: 0 % (ref 0.0–0.2)

## 2020-06-01 LAB — COMPREHENSIVE METABOLIC PANEL
ALT: 21 U/L (ref 0–44)
AST: 18 U/L (ref 15–41)
Albumin: 4.2 g/dL (ref 3.5–5.0)
Alkaline Phosphatase: 74 U/L (ref 38–126)
Anion gap: 6 (ref 5–15)
BUN: 11 mg/dL (ref 6–20)
CO2: 28 mmol/L (ref 22–32)
Calcium: 9.3 mg/dL (ref 8.9–10.3)
Chloride: 106 mmol/L (ref 98–111)
Creatinine, Ser: 0.91 mg/dL (ref 0.44–1.00)
GFR calc Af Amer: >60 mL/min (ref >60)
GFR calc non Af Amer: >60 mL/min (ref >60)
Glucose, Bld: 108 mg/dL — ABNORMAL HIGH (ref 70–99)
Potassium: 3.8 mmol/L (ref 3.5–5.1)
Sodium: 140 mmol/L (ref 135–145)
Total Bilirubin: 0.5 mg/dL (ref 0.3–1.2)
Total Protein: 7.4 g/dL (ref 6.5–8.1)

## 2020-06-01 NOTE — ED Triage Notes (Signed)
Pt here for headache, complaints of intermittent "popping out of my neck vein", and right calf pain.  My eyes look drowsy when I have been sleeping.  NAD, VSS. Ambulatory. Color WNL. No redness to calf.   Discussed pt with dr Katrinka Blazing, labs ordered.

## 2020-06-02 NOTE — ED Notes (Addendum)
No answer, called phone number as well

## 2020-06-02 NOTE — ED Notes (Signed)
No answer

## 2020-07-22 ENCOUNTER — Encounter: Payer: Self-pay | Admitting: Physician Assistant

## 2020-07-22 ENCOUNTER — Ambulatory Visit: Payer: Self-pay | Admitting: Physician Assistant

## 2020-07-22 ENCOUNTER — Other Ambulatory Visit: Payer: Self-pay

## 2020-07-22 DIAGNOSIS — Z113 Encounter for screening for infections with a predominantly sexual mode of transmission: Secondary | ICD-10-CM

## 2020-07-22 DIAGNOSIS — B3731 Acute candidiasis of vulva and vagina: Secondary | ICD-10-CM

## 2020-07-22 DIAGNOSIS — B373 Candidiasis of vulva and vagina: Secondary | ICD-10-CM

## 2020-07-22 LAB — WET PREP FOR TRICH, YEAST, CLUE: Trichomonas Exam: NEGATIVE

## 2020-07-22 MED ORDER — CLOTRIMAZOLE 1 % VA CREA: 1.0000 | TOPICAL_CREAM | Freq: Every day | VAGINAL | 0 refills | Status: AC

## 2020-07-22 NOTE — Progress Notes (Signed)
[facility] Department STI clinic/screening visit  Subjective:  Meredith Harris is a 44 y.o. female being seen today for an STI screening visit. The patient reports they do have symptoms.  Patient reports that they do not desire a pregnancy in the next year.   They reported they are not interested in discussing contraception today.  Patient's last menstrual period was 05/12/2020.   Patient has the following medical conditions:   Patient Active Problem List   Diagnosis Date Noted  . Amenorrhea 01/24/2020  . Family history of ovarian cancer 01/24/2020  . External hemorrhoid 01/24/2020  . Sepsis (HCC) 05/09/2018    Chief Complaint  Patient presents with  . SEXUALLY TRANSMITTED DISEASE    screening    HPI  Patient reports that she has had internal and external itching with white discharge for 2 weeks.  Denies other symptoms.  Reports that she has asthma and hidradenitis suppurative and that she has had a BTL and cholecystectomy.  Last HIV test was in 2020 and last pap was earlier this year.  Reports that she has irregular periods and is being followed by her Gyn.   See flowsheet for further details and programmatic requirements.    The following portions of the patient's history were reviewed and updated as appropriate: allergies, current medications, past medical history, past social history, past surgical history and problem list.  Objective:  There were no vitals filed for this visit.  Physical Exam Constitutional:      General: She is not in acute distress.    Appearance: Normal appearance.  HENT:     Head: Normocephalic and atraumatic.     Comments: No nits,lice, or hair loss. No cervical, supraclavicular or axillary adenopathy.    Mouth/Throat:     Mouth: Mucous membranes are moist.     Pharynx: Oropharynx is clear. No oropharyngeal exudate or posterior oropharyngeal erythema.  Eyes:     Conjunctiva/sclera: Conjunctivae normal.  Pulmonary:     Effort:  Pulmonary effort is normal.  Abdominal:     Palpations: Abdomen is soft. There is no mass.     Tenderness: There is no abdominal tenderness. There is no guarding or rebound.  Genitourinary:    General: Normal vulva.     Rectum: Normal.     Comments: External genitalia/pubic area without nits, lice, edema, erythema, lesions and inguinal adenopathy. Vagina with normal mucosa and small amount of white discharge. Cervix without visible lesions. Uterus firm, mobile, nt, no masses, no CMT, no adnexal tenderness or fullness. Musculoskeletal:     Cervical back: Neck supple. No tenderness.  Skin:    General: Skin is warm and dry.     Findings: No bruising, erythema, lesion or rash.  Neurological:     Mental Status: She is alert and oriented to person, place, and time.  Psychiatric:        Mood and Affect: Mood normal.        Thought Content: Thought content normal.        Judgment: Judgment normal.      Assessment and Plan:  Meredith Harris is a 44 y.o. female presenting to the [facility] Department for STI screening  1. Screening for STD (sexually transmitted disease) Patient into clinic with symptoms. Rec condoms with all sex. Await test results.  Counseled that RN will call if needs to RTC for treatment once results are back. - WET PREP FOR TRICH, YEAST, CLUE - Chlamydia/Gonorrhea [facility] - [facility] - Syphilis Serology, [facility]  2. Candidal vulvovaginitis Treat for yeast with Clotrimazole 1% vaginal cream 1 app qhs for 7 days. No sex for 10 days. - clotrimazole (CLOTRIMAZOLE-7) 1 % vaginal cream; Place 1 Applicatorful vaginally at bedtime for 7 days.  Dispense: 45 g; Refill: 0     Return if symptoms worsen or fail to improve.  No future appointments.  Matt Holmes, PA

## 2020-07-22 NOTE — Progress Notes (Signed)
Wet mount reviewed and pt received Clotrimazole vaginal cream per provider order. Counseled pt per provider orders and pt states understanding. Provider orders completed.

## 2021-01-07 ENCOUNTER — Other Ambulatory Visit: Payer: Self-pay | Admitting: Internal Medicine

## 2021-01-07 DIAGNOSIS — M7989 Other specified soft tissue disorders: Secondary | ICD-10-CM

## 2021-01-24 NOTE — Progress Notes (Signed)
PCP:  Patient, No Pcp Per (Inactive)   Chief Complaint  Patient presents with  . Gynecologic Exam  . Vaginal Itching    Discharge, abnormal odor, pain during intercourse x 1 week     HPI:      Ms. Meredith Harris is a 45 y.o. No obstetric history on file. who LMP was No LMP recorded (lmp unknown). (Menstrual status: Irregular Periods)., presents today for her annual examination.  Her menses are absent since 5/20. Were Q2 months, lasting 7 days, mod to heavy flow, no BTB, mild dysmen prior to that. Menses then stopped and no BTB/dysmen since. Hx of menorrhagia/leio, controlled with depo years ago.  Normal estradiol/FSH levels last yr, given provera for amenorrhea. Had withdrawal bleed for 5 days and then pt was supposed to communicate with me if menses didn't resume. Hasn't had a period or bleeding since. Normal TSH 4/22. Does have cramping, improved with NSAIDs.   Sex activity: single partner, contraception - tubal ligation. Has vaginal dryness, uses lubricants with sx relief. Has has increased vag d/c with irritation and odor for the past wk. Has internal pain with sex. Was on abx a few months ago. No meds to treat. Uses sens skin soap and dryer sheets. Last Pap: 01/24/20 Results were normal/neg HPV DNA. S/p cryotx age 43; normal paps since Hx of STDs: HPV with cryo  Last mammogram: 2016; had LT breast u/s with breast abscess 6/21 There is a FH of breast cancer in her mat aunt, genetic testing not indicated. There is a FH of ovarian cancer in her pat 1/2 sister. Pt is MyRisk neg 2021; IBIS=10.8%. The patient does do self-breast exams.  Tobacco use: She smokes cigars once wkly. Alcohol use: none No drug use.  Exercise: moderately active  She does get adequate calcium but not Vitamin D in her diet.  Pt with urinary frequency and urgency, sometimes with little flow. Also with dysuria and urine odor. Hx of pre-DM on recent labs. Drinks water and tea.  Pt with occas pelvic pains, hurts to  palpate RT suprapubic area for several months. No GI sx.   Hx of ext hemorrhoid. Hurts sometimes but no longer has rectal bleeding.  Pt has FP MCD.   Past Medical History:  Diagnosis Date  . Asthma   . BRCA negative 01/2020   MyRisk neg; IBIS=10.8%  . External hemorrhoid   . Family history of breast cancer   . Family history of ovarian cancer   . IBS (irritable bowel syndrome)   . Lyme disease     Past Surgical History:  Procedure Laterality Date  . CHOLECYSTECTOMY    . TUBAL LIGATION    . WOUND DEBRIDEMENT Bilateral 05/27/2020   Procedure: DEBRIDEMENT WOUND;  Surgeon: Robert Bellow, MD;  Location: [facility] ORS;  Service: General;  Laterality: Bilateral;  debridement hidradenitis right axilla and left chest wall    Family History  Problem Relation Age of Onset  . Hypertension Mother   . Stroke Mother   . Hypertension Father   . Ovarian cancer Sister 67       1/2 sister on pat side  . Breast cancer Maternal Aunt 95    Social History   Socioeconomic History  . Marital status: Single    Spouse name: Not on file  . Number of children: Not on file  . Years of education: Not on file  . Highest education level: Not on file  Occupational History  . Not on file  Tobacco Use  . Smoking status: Never Smoker  . Smokeless tobacco: Never Used  Vaping Use  . Vaping Use: Never used  Substance and Sexual Activity  . Alcohol use: Yes    Comment: socially  . Drug use: Never  . Sexual activity: Yes    Partners: Male    Birth control/protection: Surgical  Other Topics Concern  . Not on file  Social History Narrative  . Not on file   Social Determinants of Health   Financial Resource Strain: Not on file  Food Insecurity: Not on file  Transportation Needs: Not on file  Physical Activity: Not on file  Stress: Not on file  Social Connections: Not on file  Intimate Partner Violence: Not on file     Current Outpatient Medications:  .  albuterol (PROVENTIL  HFA;VENTOLIN HFA) 108 (90 Base) MCG/ACT inhaler, Inhale 2 puffs into the lungs every 6 (six) hours as needed for wheezing or shortness of breath., Disp: 1 Inhaler, Rfl: 0 .  chlorhexidine (PERIDEX) 0.12 % solution, 2 (two) times daily., Disp: , Rfl:  .  cyclobenzaprine (FLEXERIL) 5 MG tablet, Take 5-10 mg by mouth at bedtime as needed., Disp: , Rfl:  .  fluconazole (DIFLUCAN) 150 MG tablet, Take 1 tablet (150 mg total) by mouth once for 1 dose., Disp: 1 tablet, Rfl: 0 .  ibuprofen (ADVIL) 200 MG tablet, Take 400 mg by mouth every 6 (six) hours as needed for moderate pain., Disp: , Rfl:  .  medroxyPROGESTERone (PROVERA) 10 MG tablet, Take 1 tablet (10 mg total) by mouth daily for 7 days., Disp: 7 tablet, Rfl: 0 .  omeprazole (PRILOSEC) 20 MG capsule, Take 1 capsule by mouth 2 (two) times daily., Disp: , Rfl:  .  Vitamin D, Ergocalciferol, (DRISDOL) 1.25 MG (50000 UNIT) CAPS capsule, Take 1 capsule by mouth once a week., Disp: , Rfl:      ROS:  Review of Systems  Constitutional: Negative for fatigue, fever and unexpected weight change.  Respiratory: Negative for cough, shortness of breath and wheezing.   Cardiovascular: Negative for chest pain, palpitations and leg swelling.  Gastrointestinal: Negative for blood in stool, constipation, diarrhea, nausea and vomiting.  Endocrine: Negative for cold intolerance, heat intolerance and polyuria.  Genitourinary: Positive for dyspareunia, dysuria, frequency, menstrual problem, pelvic pain and vaginal discharge. Negative for flank pain, genital sores, hematuria, urgency, vaginal bleeding and vaginal pain.  Musculoskeletal: Negative for back pain, joint swelling and myalgias.  Skin: Negative for rash.  Neurological: Negative for dizziness, syncope, light-headedness, numbness and headaches.  Hematological: Negative for adenopathy.  Psychiatric/Behavioral: Negative for agitation, confusion, sleep disturbance and suicidal ideas. The patient is not  nervous/anxious.   BREAST: No symptoms   Objective: BP 130/90   Ht '5\' 5"'  (1.651 m)   Wt 213 lb (96.6 kg)   LMP  (LMP Unknown)   BMI 35.45 kg/m    Physical Exam Constitutional:      Appearance: She is well-developed.  Genitourinary:     Vulva normal.     Right Labia: No rash, tenderness or lesions.    Left Labia: No tenderness, lesions or rash.    No vaginal discharge, erythema or tenderness.      Right Adnexa: not tender and no mass present.    Left Adnexa: not tender and no mass present.    No cervical friability or polyp.     Uterus is tender.     Uterus is not enlarged.  Rectum:     External  hemorrhoid present.  Breasts:     Right: No mass, nipple discharge, skin change or tenderness.     Left: No mass, nipple discharge, skin change or tenderness.    Neck:     Thyroid: No thyromegaly.  Cardiovascular:     Rate and Rhythm: Normal rate and regular rhythm.     Heart sounds: Normal heart sounds. No murmur heard.   Pulmonary:     Effort: Pulmonary effort is normal.     Breath sounds: Normal breath sounds.  Abdominal:     Palpations: Abdomen is soft.     Tenderness: There is abdominal tenderness in the right lower quadrant and suprapubic area. There is no guarding or rebound.  Musculoskeletal:        General: Normal range of motion.     Cervical back: Normal range of motion.  Lymphadenopathy:     Cervical: No cervical adenopathy.  Neurological:     General: No focal deficit present.     Mental Status: She is alert and oriented to person, place, and time.     Cranial Nerves: No cranial nerve deficit.  Skin:    General: Skin is warm and dry.  Psychiatric:        Mood and Affect: Mood normal.        Behavior: Behavior normal.        Thought Content: Thought content normal.        Judgment: Judgment normal.  Vitals reviewed.    Results for orders placed or performed in visit on 01/27/21 (from the past 24 hour(s))  POCT Urinalysis Dipstick     Status: Normal    Collection Time: 01/27/21  1:56 PM  Result Value Ref Range   Color, UA yellow    Clarity, UA clear    Glucose, UA Negative Negative   Bilirubin, UA neg    Ketones, UA neg    Spec Grav, UA 1.020 1.010 - 1.025   Blood, UA neg    pH, UA 5.0 5.0 - 8.0   Protein, UA Negative Negative   Urobilinogen, UA     Nitrite, UA neg    Leukocytes, UA Negative Negative   Appearance     Odor    POCT Wet Prep with KOH     Status: Normal   Collection Time: 01/27/21  1:56 PM  Result Value Ref Range   Trichomonas, UA Negative    Clue Cells Wet Prep HPF POC neg    Epithelial Wet Prep HPF POC     Yeast Wet Prep HPF POC neg    Bacteria Wet Prep HPF POC     RBC Wet Prep HPF POC     WBC Wet Prep HPF POC     KOH Prep POC Negative Negative    Assessment/Plan: Encounter for annual routine gynecological examination  Encounter for screening mammogram for malignant neoplasm of breast - Plan: MM 3D SCREEN BREAST BILATERAL; pt to sched mammo  Family history of ovarian cancer - Pt is MyRisk neg. No further screening recommendations at this time  Amenorrhea - Plan: medroxyPROGESTERone (PROVERA) 10 MG tablet; since last yr. Neg labs. Rx provera eRxd, discussed importance of bleeding Q3 months. Pt to call me with menses to further discuss mgmt options and make choice. Discussed hormones, IUD, cyclic provera.   Pelvic pain - Plan: US PELVIC COMPLETE WITH TRANSVAGINAL; tender on exam. Question related to no recent menses. Neg UA. Rx provera, check GYN u/s.   Vaginal itching - Plan: fluconazole (DIFLUCAN)  150 MG tablet, POCT Wet Prep with KOH; neg exam and wet prep. Treat empirically with diflucan. Line dry underwear. F/u prn.   Urinary frequency - Plan: POCT Urinalysis Dipstick; neg UA. D/C caffeine, increase water. Could also be related to mild pre-DM at time.   Meds ordered this encounter  Medications  . medroxyPROGESTERone (PROVERA) 10 MG tablet    Sig: Take 1 tablet (10 mg total) by mouth daily for 7  days.    Dispense:  7 tablet    Refill:  0    Order Specific Question:   Supervising Provider    Answer:   Gae Dry [id number]  . fluconazole (DIFLUCAN) 150 MG tablet    Sig: Take 1 tablet (150 mg total) by mouth once for 1 dose.    Dispense:  1 tablet    Refill:  0    Order Specific Question:   Supervising Provider    Answer:   Gae Dry [[id number]]            GYN counsel breast self exam, mammography screening, menopause, adequate intake of calcium and vitamin D, diet and exercise  PT HAS FP MCD    F/U  Return in about 1 year (around 01/27/2022).  Shantavia Jha B. Alyne Martinson, PA-C 01/27/2021 1:58 PM

## 2021-01-27 ENCOUNTER — Ambulatory Visit (INDEPENDENT_AMBULATORY_CARE_PROVIDER_SITE_OTHER): Payer: 59 | Admitting: Obstetrics and Gynecology

## 2021-01-27 ENCOUNTER — Other Ambulatory Visit: Payer: Self-pay

## 2021-01-27 ENCOUNTER — Encounter: Payer: Self-pay | Admitting: Obstetrics and Gynecology

## 2021-01-27 VITALS — BP 130/90 | Ht 65.0 in | Wt 213.0 lb

## 2021-01-27 DIAGNOSIS — R102 Pelvic and perineal pain unspecified side: Secondary | ICD-10-CM

## 2021-01-27 DIAGNOSIS — N898 Other specified noninflammatory disorders of vagina: Secondary | ICD-10-CM

## 2021-01-27 DIAGNOSIS — N912 Amenorrhea, unspecified: Secondary | ICD-10-CM | POA: Diagnosis not present

## 2021-01-27 DIAGNOSIS — Z1231 Encounter for screening mammogram for malignant neoplasm of breast: Secondary | ICD-10-CM

## 2021-01-27 DIAGNOSIS — Z8041 Family history of malignant neoplasm of ovary: Secondary | ICD-10-CM

## 2021-01-27 DIAGNOSIS — R350 Frequency of micturition: Secondary | ICD-10-CM | POA: Diagnosis not present

## 2021-01-27 DIAGNOSIS — Z01419 Encounter for gynecological examination (general) (routine) without abnormal findings: Secondary | ICD-10-CM

## 2021-01-27 LAB — POCT URINALYSIS DIPSTICK
Bilirubin, UA: NEGATIVE
Blood, UA: NEGATIVE
Glucose, UA: NEGATIVE
Ketones, UA: NEGATIVE
Leukocytes, UA: NEGATIVE
Nitrite, UA: NEGATIVE
Protein, UA: NEGATIVE
Spec Grav, UA: 1.02 (ref 1.010–1.025)
pH, UA: 5 (ref 5.0–8.0)

## 2021-01-27 LAB — POCT WET PREP WITH KOH
Clue Cells Wet Prep HPF POC: NEGATIVE
KOH Prep POC: NEGATIVE
Trichomonas, UA: NEGATIVE
Yeast Wet Prep HPF POC: NEGATIVE

## 2021-01-27 MED ORDER — MEDROXYPROGESTERONE ACETATE 10 MG PO TABS: 10.0000 mg | ORAL_TABLET | Freq: Every day | ORAL | 0 refills | Status: DC

## 2021-01-27 MED ORDER — FLUCONAZOLE 150 MG PO TABS: 150.0000 mg | ORAL_TABLET | Freq: Once | ORAL | 0 refills | Status: AC

## 2021-01-27 NOTE — Patient Instructions (Addendum)
I value your feedback and you entrusting us with your care. If you get a [facility] patient survey, I would appreciate you taking the time to let us know about your experience today. Thank you!  [facility]: [phone]  [facility]: [phone]    

## 2021-02-03 ENCOUNTER — Encounter: Payer: Self-pay | Admitting: Obstetrics and Gynecology

## 2021-02-06 ENCOUNTER — Ambulatory Visit
Admission: RE | Admit: 2021-02-06 | Discharge: 2021-02-06 | Disposition: A | Discharge status: Home / Self Care | Payer: 59 | Source: Ambulatory Visit | Attending: Internal Medicine | Admitting: Internal Medicine

## 2021-02-06 ENCOUNTER — Other Ambulatory Visit: Payer: Self-pay

## 2021-02-06 DIAGNOSIS — M7989 Other specified soft tissue disorders: Secondary | ICD-10-CM | POA: Insufficient documentation

## 2021-02-17 ENCOUNTER — Telehealth: Payer: Self-pay

## 2021-02-17 ENCOUNTER — Encounter: Payer: Self-pay | Admitting: Obstetrics and Gynecology

## 2021-02-17 DIAGNOSIS — N912 Amenorrhea, unspecified: Secondary | ICD-10-CM

## 2021-02-17 NOTE — Telephone Encounter (Signed)
Will check more labs. Orders placed. Pls notify pt to sched appt.

## 2021-02-17 NOTE — Telephone Encounter (Signed)
Pt calling to let [facility] know she did not start her period; please call. [phone]

## 2021-02-17 NOTE — Telephone Encounter (Signed)
Called pt, no answer, LVMTRC. 

## 2021-02-18 NOTE — Telephone Encounter (Signed)
Called pt, no answer, LVMTRC. 

## 2021-03-12 ENCOUNTER — Other Ambulatory Visit: Payer: Self-pay

## 2021-03-12 ENCOUNTER — Ambulatory Visit
Admission: RE | Admit: 2021-03-12 | Discharge: 2021-03-12 | Disposition: A | Discharge status: Home / Self Care | Payer: 59 | Source: Ambulatory Visit | Attending: Obstetrics and Gynecology | Admitting: Obstetrics and Gynecology

## 2021-03-12 DIAGNOSIS — R102 Pelvic and perineal pain: Secondary | ICD-10-CM | POA: Diagnosis present

## 2021-03-17 ENCOUNTER — Telehealth: Payer: Self-pay | Admitting: Obstetrics and Gynecology

## 2021-03-27 NOTE — Telephone Encounter (Signed)
Pt never called back so communicated with her through MyChart message.

## 2021-08-13 ENCOUNTER — Encounter: Payer: Self-pay | Admitting: Nurse Practitioner

## 2021-08-13 ENCOUNTER — Other Ambulatory Visit: Payer: Self-pay

## 2021-08-13 ENCOUNTER — Ambulatory Visit (INDEPENDENT_AMBULATORY_CARE_PROVIDER_SITE_OTHER): Payer: 59 | Admitting: Nurse Practitioner

## 2021-08-13 VITALS — BP 150/96 | HR 71 | Temp 98.1°F | Ht 65.0 in | Wt 213.6 lb

## 2021-08-13 DIAGNOSIS — R079 Chest pain, unspecified: Secondary | ICD-10-CM | POA: Diagnosis not present

## 2021-08-13 DIAGNOSIS — R030 Elevated blood-pressure reading, without diagnosis of hypertension: Secondary | ICD-10-CM

## 2021-08-13 DIAGNOSIS — R7303 Prediabetes: Secondary | ICD-10-CM

## 2021-08-13 DIAGNOSIS — Z0000 Encounter for general adult medical examination without abnormal findings: Secondary | ICD-10-CM | POA: Diagnosis not present

## 2021-08-13 DIAGNOSIS — Z7689 Persons encountering health services in other specified circumstances: Secondary | ICD-10-CM

## 2021-08-13 DIAGNOSIS — N912 Amenorrhea, unspecified: Secondary | ICD-10-CM

## 2021-08-13 LAB — POCT URINALYSIS DIP (CLINITEK)
Bilirubin, UA: NEGATIVE
Blood, UA: NEGATIVE
Glucose, UA: NEGATIVE mg/dL
Ketones, POC UA: NEGATIVE mg/dL
Nitrite, UA: NEGATIVE
POC PROTEIN,UA: NEGATIVE
Spec Grav, UA: >=1.03 — AB (ref 1.010–1.025)
Urobilinogen, UA: 0.2 E.U./dL
pH, UA: 5.5 (ref 5.0–8.0)

## 2021-08-13 LAB — POCT GLYCOSYLATED HEMOGLOBIN (HGB A1C)
HbA1c POC (<> result, manual entry): 5.8 % (ref 4.0–5.6)
HbA1c, POC (controlled diabetic range): 5.8 % (ref 0.0–7.0)
HbA1c, POC (prediabetic range): 5.8 % (ref 5.7–6.4)
Hemoglobin A1C: 5.8 % — AB (ref 4.0–5.6)

## 2021-08-13 LAB — GLUCOSE, POCT (MANUAL RESULT ENTRY): POC Glucose: 114 mg/dl — AB (ref 70–99)

## 2021-08-13 NOTE — Patient Instructions (Addendum)
Preventive Care 92-44 Years Old, Female Preventive care refers to lifestyle choices and visits with your health care provider that can promote health and wellness. Preventive care visits are also called wellness exams. What can I expect for my preventive care visit? Counseling Your health care provider may ask you questions about your: Medical history, including: Past medical problems. Family medical history. Pregnancy history. Current health, including: Menstrual cycle. Method of birth control. Emotional well-being. Home life and relationship well-being. Sexual activity and sexual health. Lifestyle, including: Alcohol, nicotine or tobacco, and drug use. Access to firearms. Diet, exercise, and sleep habits. Work and work Statistician. Sunscreen use. Safety issues such as seatbelt and bike helmet use. Physical exam Your health care provider will check your: Height and weight. These may be used to calculate your BMI (body mass index). BMI is a measurement that tells if you are at a healthy weight. Waist circumference. This measures the distance around your waistline. This measurement also tells if you are at a healthy weight and may help predict your risk of certain diseases, such as type 2 diabetes and high blood pressure. Heart rate and blood pressure. Body temperature. Skin for abnormal spots. What immunizations do I need? Vaccines are usually given at various ages, according to a schedule. Your health care provider will recommend vaccines for you based on your age, medical history, and lifestyle or other factors, such as travel or where you work. What tests do I need? Screening Your health care provider may recommend screening tests for certain conditions. This may include: Lipid and cholesterol levels. Diabetes screening. This is done by checking your blood sugar (glucose) after you have not eaten for a while (fasting). Pelvic exam and Pap test. Hepatitis B test. Hepatitis C  test. HIV (human immunodeficiency virus) test. STI (sexually transmitted infection) testing, if you are at risk. Lung cancer screening. Colorectal cancer screening. Mammogram. Talk with your health care provider about when you should start having regular mammograms. This may depend on whether you have a family history of breast cancer. BRCA-related cancer screening. This may be done if you have a family history of breast, ovarian, tubal, or peritoneal cancers. Bone density scan. This is done to screen for osteoporosis. Talk with your health care provider about your test results, treatment options, and if necessary, the need for more tests. Follow these instructions at home: Eating and drinking  Eat a diet that includes fresh fruits and vegetables, whole grains, lean protein, and low-fat dairy products. Take vitamin and mineral supplements as recommended by your health care provider. Do not drink alcohol if: Your health care provider tells you not to drink. You are pregnant, may be pregnant, or are planning to become pregnant. If you drink alcohol: Limit how much you have to 0-1 drink a day. Know how much alcohol is in your drink. In the [place] one drink equals one 12 oz bottle of beer (355 mL), one 5 oz glass of wine (148 mL), or one 1 oz glass of hard liquor (44 mL). Lifestyle Brush your teeth every morning and night with fluoride toothpaste. Floss one time each day. Exercise for at least 30 minutes 5 or more days each week. Do not use any products that contain nicotine or tobacco. These products include cigarettes, chewing tobacco, and vaping devices, such as e-cigarettes. If you need help quitting, ask your health care provider. Do not use drugs. If you are sexually active, practice safe sex. Use a condom or other form of protection to prevent  STIs. If you do not wish to become pregnant, use a form of birth control. If you plan to become pregnant, see your health care provider for a  prepregnancy visit. Take aspirin only as told by your health care provider. Make sure that you understand how much to take and what form to take. Work with your health care provider to find out whether it is safe and beneficial for you to take aspirin daily. Find healthy ways to manage stress, such as: Meditation, yoga, or listening to music. Journaling. Talking to a trusted person. Spending time with friends and family. Minimize exposure to UV radiation to reduce your risk of skin cancer. Safety Always wear your seat belt while driving or riding in a vehicle. Do not drive: If you have been drinking alcohol. Do not ride with someone who has been drinking. When you are tired or distracted. While texting. If you have been using any mind-altering substances or drugs. Wear a helmet and other protective equipment during sports activities. If you have firearms in your house, make sure you follow all gun safety procedures. Seek help if you have been physically or sexually abused. What's next? Visit your health care provider once a year for an annual wellness visit. Ask your health care provider how often you should have your eyes and teeth checked. Stay up to date on all vaccines. This information is not intended to replace advice given to you by your health care provider. Make sure you discuss any questions you have with your health care provider. Document Revised: 03/12/2021 Document Reviewed: 03/12/2021 Elsevier Patient Education  2022 [place]. Preventing Hypertension Hypertension, also called high blood pressure, is when the force of blood pumping through the arteries is too strong. Arteries are blood vessels that carry blood from the heart throughout the body. Often, hypertension does not cause symptoms until blood pressure is very high. It is important to have your blood pressure checked regularly. Diet and lifestyle changes can help you prevent hypertension, and they may make you  feel better overall and improve your quality of life. If you already have hypertension, you may control it with diet and lifestyle changes, as well as with medicine. How can this condition affect me? Over time, hypertension can damage the arteries and decrease blood flow to important parts of the body, including the brain, heart, and kidneys. By keeping your blood pressure in a healthy range, you can help prevent complications like heart attack, heart failure, stroke, kidney failure, and vascular dementia. What can increase my risk? Being an older adult. Older people are more often affected. Having family members who have had high blood pressure. Being obese. Being female. Males are more likely to have high blood pressure. Drinking too much alcohol or caffeine. Smoking or using illegal drugs. Taking certain medicines, such as antidepressants, decongestants, birth control pills, and NSAIDs, such as ibuprofen. Having thyroid problems. Having certain tumors. What actions can I take to prevent or manage this condition? Work with your health care provider to make a hypertension prevention plan that works for you. Follow your plan and keep all follow-up visits as told by your health care provider. Diet changes Maintain a healthy diet. This includes: Eating less salt (sodium). Ask your health care provider how much sodium is safe for you to have. The general recommendation is to have less than 1 tsp (2,300 mg) of sodium a day. Do not add salt to your food. Choose low-sodium options when grocery shopping and eating out. Limiting  fats in your diet. You can do this by eating low-fat or fat-free dairy products and by eating less red meat. Eating more fruits, vegetables, and whole grains. Make a goal to eat: 1-2 cups of fresh fruits and vegetables each day. 3-4 servings of whole grains each day. Avoiding foods and beverages that have added sugars. Eating fish that contain healthy fats (omega-3 fatty  acids), such as mackerel or salmon. If you need help putting together a healthy eating plan, try the DASH diet. This diet is high in fruits, vegetables, and whole grains. It is low in sodium, red meat, and added sugars. DASH stands for Dietary Approaches to Stop Hypertension. Lifestyle changes Lose weight if you are overweight. Losing just 3?5% of your body weight can help prevent or control hypertension. For example, if your present weight is 200 lb (91 kg), a loss of 3-5% of your weight means losing 6-10 lb (2.7-4.5 kg). Ask your health care provider to help you with a diet and exercise plan to safely lose weight. Other recommendations usually include: Get enough exercise. Do at least 150 minutes of moderate-intensity exercise each week. You could do this in short exercise sessions several times a day, or you could do longer exercise sessions a few times a week. For example, you could take a brisk 10-minute walk or bike ride, 3 times a day, for 5 days a week. Find ways to reduce stress, such as exercising, meditating, listening to music, or taking a yoga class. If you need help reducing stress, ask your health care provider. Do not use any products that contain nicotine or tobacco, such as cigarettes, e-cigarettes, and chewing tobacco. If you need help quitting, ask your health care provider. Chemicals in tobacco and nicotine products raise your blood pressure each time you use them. If you need help quitting, ask your health care provider. Learn how to check your blood pressure at home. Make sure that you know your personal target blood pressure, as told by your health care provider. Try to sleep 7-9 hours per night.  Alcohol use Do not drink alcohol if: Your health care provider tells you not to drink. You are pregnant, may be pregnant, or are planning to become pregnant. If you drink alcohol: Limit how much you use to: 0-1 drink a day for women. 0-2 drinks a day for men. Be aware of how much  alcohol is in your drink. In the [place] one drink equals one 12 oz bottle of beer (355 mL), one 5 oz glass of wine (148 mL), or one 1 oz glass of hard liquor (44 mL). Medicines In addition to diet and lifestyle changes, your health care provider may recommend medicines to help lower your blood pressure. In general: You may need to try a few different medicines to find what works best for you. You may need to take more than one medicine. Take over-the-counter and prescription medicines only as told by your health care provider. Questions to ask your health care provider What is my blood pressure goal? How can I lower my risk for high blood pressure? How should I monitor my blood pressure at home? Where to find support Your health care provider can help you prevent hypertension and help you keep your blood pressure at a healthy level. Your local hospital or your community may also provide support services and prevention programs. The American Heart Association offers an online support network at supportnetwork.heart.org Where to find more information Learn more about hypertension from: [employer],  Lung, and Blood Institute: https://wilson-eaton.com/ Centers for Disease Control and Prevention: http://www.wolf.info/ American Academy of Family Physicians: familydoctor.org Learn more about the DASH diet from: [place], Lung, and [place]: https://wilson-eaton.com/ Contact a health care provider if: You think you are having a reaction to medicines you have taken. You have recurrent headaches or feel dizzy. You have swelling in your ankles. You have trouble with your vision. Get help right away if: You have sudden, severe chest, back, or abdominal pain or discomfort. You have shortness of breath. You have a sudden, severe headache. These symptoms may represent a serious problem that is an emergency. Do not wait to see if the symptoms will go away. Get medical help right away. Call your local emergency  services (911 in the [place] Do not drive yourself to the hospital.  Summary Hypertension often does not cause any symptoms until blood pressure is very high. It is important to get your blood pressure checked regularly. Diet and lifestyle changes are important steps in preventing hypertension. By keeping your blood pressure in a healthy range, you may prevent complications like heart attack, heart failure, stroke, and kidney failure. Work with your health care provider to make a hypertension prevention plan that works for you. This information is not intended to replace advice given to you by your health care provider. Make sure you discuss any questions you have with your health care provider. Document Revised: 08/15/2019 Document Reviewed: 08/15/2019 Elsevier Patient Education  2022 [place] or Strain Rehab Ask your health care provider which exercises are safe for you. Do exercises exactly as told by your health care provider and adjust them as directed. It is normal to feel mild stretching, pulling, tightness, or discomfort as you do these exercises. Stop right away if you feel sudden pain or your pain gets worse. Do not begin these exercises until told by your health care provider. Stretching and range-of-motion exercises These exercises warm up your muscles and joints and improve the movement and flexibility of your back. These exercises also help to relieve pain, numbness, and tingling. Lumbar rotation  Lie on your back on a firm bed or the floor with your knees bent. Straighten your arms out to your sides so each arm forms a 90-degree angle (right angle) with a side of your body. Slowly move (rotate) both of your knees to one side of your body until you feel a stretch in your lower back (lumbar). Try not to let your shoulders lift off the floor. Hold this position for __________ seconds. Tense your abdominal muscles and slowly move your knees back to the starting  position. Repeat this exercise on the other side of your body. Repeat __________ times. Complete this exercise __________ times a day. Single knee to chest  Lie on your back on a firm bed or the floor with both legs straight. Bend one of your knees. Use your hands to move your knee up toward your chest until you feel a gentle stretch in your lower back and buttock. Hold your leg in this position by holding on to the front of your knee. Keep your other leg as straight as possible. Hold this position for __________ seconds. Slowly return to the starting position. Repeat with your other leg. Repeat __________ times. Complete this exercise __________ times a day. Prone extension on elbows  Lie on your abdomen on a firm bed or the floor (prone position). Prop yourself up on your elbows. Use your arms to  help lift your chest up until you feel a gentle stretch in your abdomen and your lower back. This will place some of your body weight on your elbows. If this is uncomfortable, try stacking pillows under your chest. Your hips should stay down, against the surface that you are lying on. Keep your hip and back muscles relaxed. Hold this position for __________ seconds. Slowly relax your upper body and return to the starting position. Repeat __________ times. Complete this exercise __________ times a day. Strengthening exercises These exercises build strength and endurance in your back. Endurance is the ability to use your muscles for a long time, even after they get tired. Pelvic tilt This exercise strengthens the muscles that lie deep in the abdomen. Lie on your back on a firm bed or the floor with your legs extended. Bend your knees so they are pointing toward the ceiling and your feet are flat on the floor. Tighten your lower abdominal muscles to press your lower back against the floor. This motion will tilt your pelvis so your tailbone points up toward the ceiling instead of pointing to your  feet or the floor. To help with this exercise, you may place a small towel under your lower back and try to push your back into the towel. Hold this position for __________ seconds. Let your muscles relax completely before you repeat this exercise. Repeat __________ times. Complete this exercise __________ times a day. Alternating arm and leg raises  Get on your hands and knees on a firm surface. If you are on a hard floor, you may want to use padding, such as an exercise mat, to cushion your knees. Line up your arms and legs. Your hands should be directly below your shoulders, and your knees should be directly below your hips. Lift your left leg behind you. At the same time, raise your right arm and straighten it in front of you. Do not lift your leg higher than your hip. Do not lift your arm higher than your shoulder. Keep your abdominal and back muscles tight. Keep your hips facing the ground. Do not arch your back. Keep your balance carefully, and do not hold your breath. Hold this position for __________ seconds. Slowly return to the starting position. Repeat with your right leg and your left arm. Repeat __________ times. Complete this exercise __________ times a day. Abdominal set with straight leg raise  Lie on your back on a firm bed or the floor. Bend one of your knees and keep your other leg straight. Tense your abdominal muscles and lift your straight leg up, 4-6 inches (10-15 cm) off the ground. Keep your abdominal muscles tight and hold this position for __________ seconds. Do not hold your breath. Do not arch your back. Keep it flat against the ground. Keep your abdominal muscles tense as you slowly lower your leg back to the starting position. Repeat with your other leg. Repeat __________ times. Complete this exercise __________ times a day. Single leg lower with bent knees Lie on your back on a firm bed or the floor. Tense your abdominal muscles and lift your feet off  the floor, one foot at a time, so your knees and hips are bent in 90-degree angles (right angles). Your knees should be over your hips and your lower legs should be parallel to the floor. Keeping your abdominal muscles tense and your knee bent, slowly lower one of your legs so your toe touches the ground. Lift your leg back up  to return to the starting position. Do not hold your breath. Do not let your back arch. Keep your back flat against the ground. Repeat with your other leg. Repeat __________ times. Complete this exercise __________ times a day. Posture and body mechanics Good posture and healthy body mechanics can help to relieve stress in your body's tissues and joints. Body mechanics refers to the movements and positions of your body while you do your daily activities. Posture is part of body mechanics. Good posture means: Your spine is in its natural S-curve position (neutral). Your shoulders are pulled back slightly. Your head is not tipped forward (neutral). Follow these guidelines to improve your posture and body mechanics in your everyday activities. Standing  When standing, keep your spine neutral and your feet about hip-width apart. Keep a slight bend in your knees. Your ears, shoulders, and hips should line up. When you do a task in which you stand in one place for a long time, place one foot up on a stable object that is 2-4 inches (5-10 cm) high, such as a footstool. This helps keep your spine neutral. Sitting  When sitting, keep your spine neutral and keep your feet flat on the floor. Use a footrest, if necessary, and keep your thighs parallel to the floor. Avoid rounding your shoulders, and avoid tilting your head forward. When working at a desk or a computer, keep your desk at a height where your hands are slightly lower than your elbows. Slide your chair under your desk so you are close enough to maintain good posture. When working at a computer, place your monitor at a  height where you are looking straight ahead and you do not have to tilt your head forward or downward to look at the screen. Resting When lying down and resting, avoid positions that are most painful for you. If you have pain with activities such as sitting, bending, stooping, or squatting, lie in a position in which your body does not bend very much. For example, avoid curling up on your side with your arms and knees near your chest (fetal position). If you have pain with activities such as standing for a long time or reaching with your arms, lie with your spine in a neutral position and bend your knees slightly. Try the following positions: Lying on your side with a pillow between your knees. Lying on your back with a pillow under your knees. Lifting  When lifting objects, keep your feet at least shoulder-width apart and tighten your abdominal muscles. Bend your knees and hips and keep your spine neutral. It is important to lift using the strength of your legs, not your back. Do not lock your knees straight out. Always ask for help to lift heavy or awkward objects. This information is not intended to replace advice given to you by your health care provider. Make sure you discuss any questions you have with your health care provider. Document Revised: 12/02/2020 Document Reviewed: 12/02/2020 Elsevier Patient Education  2022 [place].  Prediabetes Eating Plan Prediabetes is a condition that causes blood sugar (glucose) levels to be higher than normal. This increases the risk for developing type 2 diabetes (type 2 diabetes mellitus). Working with a health care provider or nutrition specialist (dietitian) to make diet and lifestyle changes can help prevent the onset of diabetes. These changes may help you: Control your blood glucose levels. Improve your cholesterol levels. Manage your blood pressure. What are tips for following this plan? Reading food labels  Read food labels to check the  amount of fat, salt (sodium), and sugar in prepackaged foods. Avoid foods that have: Saturated fats. Trans fats. Added sugars. Avoid foods that have more than 300 milligrams (mg) of sodium per serving. Limit your sodium intake to less than 2,300 mg each day. Shopping Avoid buying pre-made and processed foods. Avoid buying drinks with added sugar. Cooking Cook with olive oil. Do not use butter, lard, or ghee. Bake, broil, grill, steam, or boil foods. Avoid frying. Meal planning  Work with your dietitian to create an eating plan that is right for you. This may include tracking how many calories you take in each day. Use a food diary, notebook, or mobile application to track what you eat at each meal. Consider following a Mediterranean diet. This includes: Eating several servings of fresh fruits and vegetables each day. Eating fish at least twice a week. Eating one serving each day of whole grains, beans, nuts, and seeds. Using olive oil instead of other fats. Limiting alcohol. Limiting red meat. Using nonfat or low-fat dairy products. Consider following a plant-based diet. This includes dietary choices that focus on eating mostly vegetables and fruit, grains, beans, nuts, and seeds. If you have high blood pressure, you may need to limit your sodium intake or follow a diet such as the DASH (Dietary Approaches to Stop Hypertension) eating plan. The DASH diet aims to lower high blood pressure. Lifestyle Set weight loss goals with help from your health care team. It is recommended that most people with prediabetes lose 7% of their body weight. Exercise for at least 30 minutes 5 or more days a week. Attend a support group or seek support from a mental health counselor. Take over-the-counter and prescription medicines only as told by your health care provider. What foods are recommended? Fruits Berries. Bananas. Apples. Oranges. Grapes. Papaya. Mango. Pomegranate. Kiwi. Grapefruit.  Cherries. Vegetables Lettuce. Spinach. Peas. Beets. Cauliflower. Cabbage. Broccoli. Carrots. Tomatoes. Squash. Eggplant. Herbs. Peppers. Onions. Cucumbers. Brussels sprouts. Grains Whole grains, such as whole-wheat or whole-grain breads, crackers, cereals, and pasta. Unsweetened oatmeal. Bulgur. Barley. Quinoa. Brown rice. Corn or whole-wheat flour tortillas or taco shells. Meats and other proteins Seafood. Poultry without skin. Lean cuts of pork and beef. Tofu. Eggs. Nuts. Beans. Dairy Low-fat or fat-free dairy products, such as yogurt, cottage cheese, and cheese. Beverages Water. Tea. Coffee. Sugar-free or diet soda. Seltzer water. Low-fat or nonfat milk. Milk alternatives, such as soy or almond milk. Fats and oils Olive oil. Canola oil. Sunflower oil. Grapeseed oil. Avocado. Walnuts. Sweets and desserts Sugar-free or low-fat pudding. Sugar-free or low-fat ice cream and other frozen treats. Seasonings and condiments Herbs. Sodium-free spices. Mustard. Relish. Low-salt, low-sugar ketchup. Low-salt, low-sugar barbecue sauce. Low-fat or fat-free mayonnaise. The items listed above may not be a complete list of recommended foods and beverages. Contact a dietitian for more information. What foods are not recommended? Fruits Fruits canned with syrup. Vegetables Canned vegetables. Frozen vegetables with butter or cream sauce. Grains Refined white flour and flour products, such as bread, pasta, snack foods, and cereals. Meats and other proteins Fatty cuts of meat. Poultry with skin. Breaded or fried meat. Processed meats. Dairy Full-fat yogurt, cheese, or milk. Beverages Sweetened drinks, such as iced tea and soda. Fats and oils Butter. Lard. Ghee. Sweets and desserts Baked goods, such as cake, cupcakes, pastries, cookies, and cheesecake. Seasonings and condiments Spice mixes with added salt. Ketchup. Barbecue sauce. Mayonnaise. The items listed above may not be a complete list  of foods  and beverages that are not recommended. Contact a dietitian for more information. Where to find more information American Diabetes Association: www.diabetes.org Summary You may need to make diet and lifestyle changes to help prevent the onset of diabetes. These changes can help you control blood sugar, improve cholesterol levels, and manage blood pressure. Set weight loss goals with help from your health care team. It is recommended that most people with prediabetes lose 7% of their body weight. Consider following a Mediterranean diet. This includes eating plenty of fresh fruits and vegetables, whole grains, beans, nuts, seeds, fish, and low-fat dairy, and using olive oil instead of other fats. This information is not intended to replace advice given to you by your health care provider. Make sure you discuss any questions you have with your health care provider. Document Revised: 12/14/2019 Document Reviewed: 12/14/2019 Elsevier Patient Education  [place].

## 2021-08-13 NOTE — Progress Notes (Signed)
[place], [place]  [address] Phone:  [phone]   Fax:  [phone]   New Patient Office Visit  Subjective:  Patient ID: Meredith Harris, female    DOB: 06-18-1976  Age: 45 y.o. MRN: [id number]  CC:  Chief Complaint  Patient presents with   Establish Care    Pt is here today to establish care. Pt states she has been having some tightness in the left side of her chest that radiates to her left shoulder associated with some pain. Symptoms occurred 3 months ago and patient was evaluated by her former PCP. Stress test was ordered but was never done due to insurance coverage.     HPI Meredith Harris presents for follow up. She  has a past medical history of Asthma, BRCA negative (01/2020), External hemorrhoid, Family history of breast cancer, Family history of ovarian cancer, GERD (gastroesophageal reflux disease), IBS (irritable bowel syndrome), and Lyme disease.   Chest Pain [name] complains of chest pain. Onset was 4 months ago. Symptoms have worsened since that time. The patient's pain is intermittent. The patient describes the pain as pressure and radiates to the left shoulder. Patient rates pain as a 6/10 in intensity. Associated symptoms are: lower extremity edema and hand swelling  . Aggravating factors are: none. Alleviating factors are: none. Patient's cardiac risk factors are: obesity (BMI >= 30 kg/m2) and former smoker. She reports that she walk around th park . This does not cause pain.  Patient's risk factors for DVT/PE: none. Previous cardiac testing: exercise stress test 2016 negative.  She reports that in 2010 she hurt her shoulder assisting a [place]. She did not get therapy for the shoulder.   Past Medical History:  Diagnosis Date   Asthma    BRCA negative 01/2020   MyRisk neg; IBIS=10.8%   External hemorrhoid    Family history of breast cancer    Family history of ovarian cancer    GERD  (gastroesophageal reflux disease)    IBS (irritable bowel syndrome)    Lyme disease     Past Surgical History:  Procedure Laterality Date   CHOLECYSTECTOMY     TUBAL LIGATION     WOUND DEBRIDEMENT Bilateral 05/27/2020   Procedure: DEBRIDEMENT WOUND;  Surgeon: Robert Bellow, MD;  Location: [facility] ORS;  Service: General;  Laterality: Bilateral;  debridement hidradenitis right axilla and left chest wall    Family History  Problem Relation Age of Onset   Hypertension Mother    Stroke Mother    Hypertension Father    Ovarian cancer Sister 52       1/2 sister on pat side   Breast cancer Maternal Aunt 33    Social History   Socioeconomic History   Marital status: Single    Spouse name: Not on file   Number of children: Not on file   Years of education: Not on file   Highest education level: Not on file  Occupational History   Not on file  Tobacco Use   Smoking status: Never   Smokeless tobacco: Never  Vaping Use   Vaping Use: Never used  Substance and Sexual Activity   Alcohol use: Yes    Comment: socially   Drug use: Never   Sexual activity: Yes    Partners: Male    Birth control/protection: Surgical  Other Topics Concern   Not on file  Social History Narrative  Not on file   Social Determinants of Health   Financial Resource Strain: Not on file  Food Insecurity: Not on file  Transportation Needs: Not on file  Physical Activity: Not on file  Stress: Not on file  Social Connections: Not on file  Intimate Partner Violence: Not on file    ROS Review of Systems  Objective:   Today's Vitals: BP (!) 150/96   Pulse 71   Temp 98.1 F (36.7 C)   Ht 5' 5" (1.651 m)   Wt 213 lb 9.6 oz (96.9 kg)   SpO2 100%   BMI 35.54 kg/m   Physical Exam Constitutional:      Appearance: She is obese.  HENT:     Head: Normocephalic and atraumatic.     Mouth/Throat:     Mouth: Mucous membranes are moist.  Cardiovascular:     Rate and Rhythm: Normal rate and regular  rhythm.     Pulses: Normal pulses.     Heart sounds: Normal heart sounds.  Pulmonary:     Effort: Pulmonary effort is normal.     Breath sounds: Normal breath sounds.  Abdominal:     Palpations: Abdomen is soft.  Musculoskeletal:        General: Normal range of motion.     Cervical back: Normal range of motion.     Right lower leg: No edema.     Left lower leg: No edema.  Skin:    General: Skin is warm and dry.     Capillary Refill: Capillary refill takes less than 2 seconds.  Neurological:     General: No focal deficit present.     Mental Status: She is alert and oriented to person, place, and time.  Psychiatric:        Mood and Affect: Mood normal.        Behavior: Behavior normal.        Thought Content: Thought content normal.        Judgment: Judgment normal.    Assessment & Plan:   Problem List Items Addressed This Visit       Other   Amenorrhea   Other Visit Diagnoses     Encounter to establish care    -  Primary Discussed female health maintenance; SBE, annual CBE, PAP test Discussed general safety in vehicle and COVID Discussed regular hydration with water Discussed healthy diet and exercise and weight management Discussed sexual health  Discussed mental health Encouraged to call our office for an appointment with in ongoing concerns for questions.      Healthcare maintenance       Relevant Orders   POCT URINALYSIS DIP (CLINITEK) (Completed)   Glucose (CBG) (Completed)   HgB A1c (Completed)   Chest pain, unspecified type     Persistent we will place referral for cardiology for evaluation   Relevant Orders   Ambulatory referral to Cardiology   Elevated BP without diagnosis of hypertension  Encouraged home monitoring and recording BP <130/80 Eating a heart-healthy diet with less salt Encouraged regular physical activity  Recommend Weight loss Education provided     Prediabetes      Consider home glucose monitoring Weight loss at least 5% of  current body weight is can be achieved with lifestyle modification dietary changes and regular daily exercise Encourage blood pressure control goal <120/80 and maintaining total cholesterol <200 Follow-up every 3 to 6 months for reevaluation Education material provided      Outpatient Encounter Medications as of 08/13/2021  Medication Sig   albuterol (PROVENTIL HFA;VENTOLIN HFA) 108 (90 Base) MCG/ACT inhaler Inhale 2 puffs into the lungs every 6 (six) hours as needed for wheezing or shortness of breath.   chlorhexidine (PERIDEX) 0.12 % solution 2 (two) times daily. (Patient not taking: Reported on 08/13/2021)   cyclobenzaprine (FLEXERIL) 5 MG tablet Take 5-10 mg by mouth at bedtime as needed. (Patient not taking: Reported on 08/13/2021)   ibuprofen (ADVIL) 200 MG tablet Take 400 mg by mouth every 6 (six) hours as needed for moderate pain. (Patient not taking: Reported on 08/13/2021)   medroxyPROGESTERone (PROVERA) 10 MG tablet Take 1 tablet (10 mg total) by mouth daily for 7 days.   omeprazole (PRILOSEC) 20 MG capsule Take 1 capsule by mouth 2 (two) times daily. (Patient not taking: Reported on 08/13/2021)   Vitamin D, Ergocalciferol, (DRISDOL) 1.25 MG (50000 UNIT) CAPS capsule Take 1 capsule by mouth once a week. (Patient not taking: Reported on 08/13/2021)   No facility-administered encounter medications on file as of 08/13/2021.    Follow-up: Return in about 3 months (around 11/13/2021) for BP evalution.   Crystal M King, NP  

## 2021-08-14 LAB — FSH/LH
FSH: 33.7 m[IU]/mL
LH: 29.1 m[IU]/mL

## 2021-08-14 LAB — PROLACTIN: Prolactin: 7.1 ng/mL (ref 4.8–23.3)

## 2021-08-14 LAB — ESTRADIOL: Estradiol: 13.1 pg/mL

## 2021-08-26 ENCOUNTER — Telehealth: Payer: Self-pay | Admitting: Obstetrics and Gynecology

## 2021-08-26 DIAGNOSIS — E28319 Asymptomatic premature menopause: Secondary | ICD-10-CM

## 2021-08-26 DIAGNOSIS — N951 Menopausal and female climacteric states: Secondary | ICD-10-CM

## 2021-08-26 MED ORDER — ESTRADIOL-NORETHINDRONE ACET 0.5-0.1 MG PO TABS: 1.0000 | ORAL_TABLET | Freq: Every day | ORAL | 2 refills | Status: DC

## 2021-08-26 NOTE — Telephone Encounter (Signed)
Pt with premature menopause on labs. Having vasomotor sx. Interested in trying low dose HRT for sx and bone/cardiovascular benefits. Rx activella. F/u prn  Meds ordered this encounter  Medications   Estradiol-Norethindrone Acet 0.5-0.1 MG tablet    Sig: Take 1 tablet by mouth daily.    Dispense:  90 tablet    Refill:  2    Order Specific Question:   Supervising Provider    Answer:   Nadara Mustard [[id number]]

## 2021-11-10 ENCOUNTER — Ambulatory Visit: Payer: Self-pay | Admitting: Nurse Practitioner

## 2022-03-12 ENCOUNTER — Telehealth: Payer: Self-pay | Admitting: Obstetrics and Gynecology

## 2022-03-12 NOTE — Telephone Encounter (Signed)
Patient called after hour nurse line. Caller States that she needs an appointment. 03/11/22 at  4:34:04 PM. I called and left voicemail for patient to call back to be scheduled.

## 2022-03-24 NOTE — Telephone Encounter (Signed)
Patient is scheduled for 05/05/22 for annual exam

## 2022-05-04 NOTE — Progress Notes (Unsigned)
PCP:  Vevelyn Francois, NP   No chief complaint on file.    HPI:      Meredith Harris is a 46 y.o. No obstetric history on file. who LMP was No LMP recorded. (Menstrual status: Irregular Periods)., presents today for her annual examination.  Her menses are absent since 5/20. Were Q2 months, lasting 7 days, mod to heavy flow, no BTB, mild dysmen prior to that. Menses then stopped and no BTB/dysmen since. Hx of menorrhagia/leio, controlled with depo years ago.  Normal estradiol/FSH levels last yr, given provera for amenorrhea. Had withdrawal bleed for 5 days and then pt was supposed to communicate with me if menses didn't resume. Hasn't had a period or bleeding since. Normal TSH 4/22. Does have cramping, improved with NSAIDs.   Hx of prematura menopause per 11/22 labs, pt started HRT  Sex activity: single partner, contraception - tubal ligation. Has vaginal dryness, uses lubricants with sx relief. Has has increased vag d/c with irritation and odor for the past wk. Has internal pain with sex. Was on abx a few months ago. No meds to treat. Uses sens skin soap and dryer sheets. Last Pap: 01/24/20 Results were normal/neg HPV DNA. S/p cryotx age 66; normal paps since Hx of STDs: HPV with cryo  Last mamm[facility]: 02/03/21 at [facility]; Results were normal, repeat in 12 months; s/p LT breast abscess 6/21 There is a FH of breast cancer in her mat aunt, genetic testing not indicated. There is a FH of ovarian cancer in her pat 1/2 sister. Pt is MyRisk neg 2021; IBIS=10.8%. The patient does do self-breast exams.  Tobacco use: She smokes cigars once wkly. Alcohol use: none No drug use.  Exercise: moderately active  She does get adequate calcium but not Vitamin D in her diet.  Pt with urinary frequency and urgency, sometimes with little flow. Also with dysuria and urine odor. Hx of pre-DM on recent labs. Drinks water and tea.  Pt with occas pelvic pains, hurts to palpate RT suprapubic area for several months.  No GI sx.   Hx of ext hemorrhoid. Hurts sometimes but no longer has rectal bleeding.  Pt has FP MCD.   Past Medical History:  Diagnosis Date   Asthma    BRCA negative 01/2020   MyRisk neg; IBIS=10.8%   External hemorrhoid    Family history of breast cancer    Family history of ovarian cancer    GERD (gastroesophageal reflux disease)    IBS (irritable bowel syndrome)    Lyme disease     Past Surgical History:  Procedure Laterality Date   CHOLECYSTECTOMY     TUBAL LIGATION     WOUND DEBRIDEMENT Bilateral 05/27/2020   Procedure: DEBRIDEMENT WOUND;  Surgeon: Robert Bellow, MD;  Location: [facility] ORS;  Service: General;  Laterality: Bilateral;  debridement hidradenitis right axilla and left chest wall    Family History  Problem Relation Age of Onset   Hypertension Mother    Stroke Mother    Hypertension Father    Ovarian cancer Sister 62       1/2 sister on pat side   Breast cancer Maternal Aunt 64    Social History   Socioeconomic History   Marital status: Single    Spouse name: Not on file   Number of children: Not on file   Years of education: Not on file   Highest education level: Not on file  Occupational History   Not on file  Tobacco  Use   Smoking status: Never   Smokeless tobacco: Never  Vaping Use   Vaping Use: Never used  Substance and Sexual Activity   Alcohol use: Yes    Comment: socially   Drug use: Never   Sexual activity: Yes    Partners: Male    Birth control/protection: Surgical  Other Topics Concern   Not on file  Social History Narrative   Not on file   Social Determinants of Health   Financial Resource Strain: Not on file  Food Insecurity: Not on file  Transportation Needs: Not on file  Physical Activity: Not on file  Stress: Not on file  Social Connections: Not on file  Intimate Partner Violence: Not on file     Current Outpatient Medications:    albuterol (PROVENTIL HFA;VENTOLIN HFA) 108 (90 Base) MCG/ACT inhaler, Inhale  2 puffs into the lungs every 6 (six) hours as needed for wheezing or shortness of breath., Disp: 1 Inhaler, Rfl: 0   chlorhexidine (PERIDEX) 0.12 % solution, 2 (two) times daily. (Patient not taking: Reported on 08/13/2021), Disp: , Rfl:    cyclobenzaprine (FLEXERIL) 5 MG tablet, Take 5-10 mg by mouth at bedtime as needed. (Patient not taking: Reported on 08/13/2021), Disp: , Rfl:    Estradiol-Norethindrone Acet 0.5-0.1 MG tablet, Take 1 tablet by mouth daily., Disp: 90 tablet, Rfl: 2   ibuprofen (ADVIL) 200 MG tablet, Take 400 mg by mouth every 6 (six) hours as needed for moderate pain. (Patient not taking: Reported on 08/13/2021), Disp: , Rfl:    omeprazole (PRILOSEC) 20 MG capsule, Take 1 capsule by mouth 2 (two) times daily. (Patient not taking: Reported on 08/13/2021), Disp: , Rfl:    Vitamin D, Ergocalciferol, (DRISDOL) 1.25 MG (50000 UNIT) CAPS capsule, Take 1 capsule by mouth once a week. (Patient not taking: Reported on 08/13/2021), Disp: , Rfl:      ROS:  Review of Systems  Constitutional:  Negative for fatigue, fever and unexpected weight change.  Respiratory:  Negative for cough, shortness of breath and wheezing.   Cardiovascular:  Negative for chest pain, palpitations and leg swelling.  Gastrointestinal:  Negative for blood in stool, constipation, diarrhea, nausea and vomiting.  Endocrine: Negative for cold intolerance, heat intolerance and polyuria.  Genitourinary:  Positive for dyspareunia, dysuria, frequency, menstrual problem, pelvic pain and vaginal discharge. Negative for flank pain, genital sores, hematuria, urgency, vaginal bleeding and vaginal pain.  Musculoskeletal:  Negative for back pain, joint swelling and myalgias.  Skin:  Negative for rash.  Neurological:  Negative for dizziness, syncope, light-headedness, numbness and headaches.  Hematological:  Negative for adenopathy.  Psychiatric/Behavioral:  Negative for agitation, confusion, sleep disturbance and suicidal  ideas. The patient is not nervous/anxious.   BREAST: No symptoms   Objective: There were no vitals taken for this visit.   Physical Exam Constitutional:      Appearance: She is well-developed.  Genitourinary:     Vulva normal.     Right Labia: No rash, tenderness or lesions.    Left Labia: No tenderness, lesions or rash.    No vaginal discharge, erythema or tenderness.      Right Adnexa: not tender and no mass present.    Left Adnexa: not tender and no mass present.    No cervical friability or polyp.     Uterus is tender.     Uterus is not enlarged.  Rectum:     External hemorrhoid present.  Breasts:    Right: No mass, nipple discharge, skin  change or tenderness.     Left: No mass, nipple discharge, skin change or tenderness.  Neck:     Thyroid: No thyromegaly.  Cardiovascular:     Rate and Rhythm: Normal rate and regular rhythm.     Heart sounds: Normal heart sounds. No murmur heard. Pulmonary:     Effort: Pulmonary effort is normal.     Breath sounds: Normal breath sounds.  Abdominal:     Palpations: Abdomen is soft.     Tenderness: There is abdominal tenderness in the right lower quadrant and suprapubic area. There is no guarding or rebound.  Musculoskeletal:        General: Normal range of motion.     Cervical back: Normal range of motion.  Lymphadenopathy:     Cervical: No cervical adenopathy.  Neurological:     General: No focal deficit present.     Mental Status: She is alert and oriented to person, place, and time.     Cranial Nerves: No cranial nerve deficit.  Skin:    General: Skin is warm and dry.  Psychiatric:        Mood and Affect: Mood normal.        Behavior: Behavior normal.        Thought Content: Thought content normal.        Judgment: Judgment normal.  Vitals reviewed.    No results found for this or any previous visit (from the past 24 hour(s)).   Assessment/Plan: Encounter for annual routine gynecological examination  Encounter  for screening mammogram for malignant neoplasm of breast - Plan: MM 3D SCREEN BREAST BILATERAL; pt to sched mammo  Family history of ovarian cancer - Pt is MyRisk neg. No further screening recommendations at this time  Amenorrhea - Plan: medroxyPROGESTERone (PROVERA) 10 MG tablet; since last yr. Neg labs. Rx provera eRxd, discussed importance of bleeding Q3 months. Pt to call me with menses to further discuss mgmt options and make choice. Discussed hormones, IUD, cyclic provera.   Pelvic pain - Plan: US PELVIC COMPLETE WITH TRANSVAGINAL; tender on exam. Question related to no recent menses. Neg UA. Rx provera, check GYN u/s.   Vaginal itching - Plan: fluconazole (DIFLUCAN) 150 MG tablet, POCT Wet Prep with KOH; neg exam and wet prep. Treat empirically with diflucan. Line dry underwear. F/u prn.   Urinary frequency - Plan: POCT Urinalysis Dipstick; neg UA. D/C caffeine, increase water. Could also be related to mild pre-DM at time.   No orders of the defined types were placed in this encounter.           GYN counsel breast self exam, mammography screening, menopause, adequate intake of calcium and vitamin D, diet and exercise  PT HAS FP MCD    F/U  No follow-ups on file.  Meredith Rexrode B. Jachelle Fluty, PA-C 05/04/2022 1:30 PM

## 2022-05-05 ENCOUNTER — Encounter: Payer: Self-pay | Admitting: Obstetrics and Gynecology

## 2022-05-05 ENCOUNTER — Ambulatory Visit (INDEPENDENT_AMBULATORY_CARE_PROVIDER_SITE_OTHER): Payer: BC Managed Care – PPO | Admitting: Obstetrics and Gynecology

## 2022-05-05 VITALS — BP 130/80 | Ht 65.0 in | Wt 206.0 lb

## 2022-05-05 DIAGNOSIS — N644 Mastodynia: Secondary | ICD-10-CM | POA: Diagnosis not present

## 2022-05-05 DIAGNOSIS — Z01411 Encounter for gynecological examination (general) (routine) with abnormal findings: Secondary | ICD-10-CM

## 2022-05-05 DIAGNOSIS — Z1231 Encounter for screening mammogram for malignant neoplasm of breast: Secondary | ICD-10-CM | POA: Diagnosis not present

## 2022-05-05 DIAGNOSIS — N898 Other specified noninflammatory disorders of vagina: Secondary | ICD-10-CM | POA: Diagnosis not present

## 2022-05-05 DIAGNOSIS — N760 Acute vaginitis: Secondary | ICD-10-CM | POA: Insufficient documentation

## 2022-05-05 DIAGNOSIS — N951 Menopausal and female climacteric states: Secondary | ICD-10-CM

## 2022-05-05 DIAGNOSIS — Z01419 Encounter for gynecological examination (general) (routine) without abnormal findings: Secondary | ICD-10-CM

## 2022-05-05 DIAGNOSIS — B9689 Other specified bacterial agents as the cause of diseases classified elsewhere: Secondary | ICD-10-CM | POA: Insufficient documentation

## 2022-05-05 DIAGNOSIS — E28319 Asymptomatic premature menopause: Secondary | ICD-10-CM | POA: Diagnosis not present

## 2022-05-05 LAB — POCT WET PREP WITH KOH
Clue Cells Wet Prep HPF POC: POSITIVE
KOH Prep POC: POSITIVE — AB
Trichomonas, UA: NEGATIVE
Yeast Wet Prep HPF POC: NEGATIVE

## 2022-05-05 MED ORDER — ESTRADIOL-NORETHINDRONE ACET 0.5-0.1 MG PO TABS: 1.0000 | ORAL_TABLET | Freq: Every day | ORAL | 3 refills | Status: DC

## 2022-05-05 NOTE — Patient Instructions (Signed)
I value your feedback and you entrusting us with your care. If you get a [facility] patient survey, I would appreciate you taking the time to let us know about your experience today. Thank you! ? ? ?

## 2022-05-11 ENCOUNTER — Inpatient Hospital Stay
Admission: RE | Admit: 2022-05-11 | Discharge: 2022-05-11 | Disposition: A | Discharge status: Home / Self Care | Payer: Self-pay | Source: Ambulatory Visit | Attending: *Deleted | Admitting: *Deleted

## 2022-05-11 ENCOUNTER — Other Ambulatory Visit: Payer: Self-pay | Admitting: *Deleted

## 2022-05-11 DIAGNOSIS — Z1231 Encounter for screening mammogram for malignant neoplasm of breast: Secondary | ICD-10-CM

## 2022-05-20 ENCOUNTER — Other Ambulatory Visit: Payer: Self-pay | Admitting: Family Medicine

## 2022-05-20 DIAGNOSIS — M5416 Radiculopathy, lumbar region: Secondary | ICD-10-CM

## 2022-06-02 ENCOUNTER — Ambulatory Visit
Admission: RE | Admit: 2022-06-02 | Discharge: 2022-06-02 | Disposition: A | Discharge status: Home / Self Care | Payer: BC Managed Care – PPO | Source: Ambulatory Visit | Attending: Obstetrics and Gynecology | Admitting: Obstetrics and Gynecology

## 2022-06-02 DIAGNOSIS — N644 Mastodynia: Secondary | ICD-10-CM | POA: Diagnosis present

## 2022-06-02 DIAGNOSIS — Z1231 Encounter for screening mammogram for malignant neoplasm of breast: Secondary | ICD-10-CM

## 2022-06-04 ENCOUNTER — Ambulatory Visit
Admission: RE | Admit: 2022-06-04 | Discharge: 2022-06-04 | Disposition: A | Discharge status: Home / Self Care | Payer: BC Managed Care – PPO | Source: Ambulatory Visit | Attending: Family Medicine | Admitting: Family Medicine

## 2022-06-04 DIAGNOSIS — M5416 Radiculopathy, lumbar region: Secondary | ICD-10-CM

## 2022-06-06 ENCOUNTER — Encounter: Payer: Self-pay | Admitting: Emergency Medicine

## 2022-06-06 ENCOUNTER — Other Ambulatory Visit: Payer: Self-pay

## 2022-06-06 ENCOUNTER — Emergency Department
Admission: EM | Admit: 2022-06-06 | Discharge: 2022-06-06 | Disposition: A | Discharge status: Home / Self Care | Payer: BC Managed Care – PPO | Attending: Emergency Medicine | Admitting: Emergency Medicine

## 2022-06-06 DIAGNOSIS — G8929 Other chronic pain: Secondary | ICD-10-CM | POA: Diagnosis not present

## 2022-06-06 DIAGNOSIS — M545 Low back pain, unspecified: Secondary | ICD-10-CM | POA: Diagnosis present

## 2022-06-06 LAB — URINALYSIS, ROUTINE W REFLEX MICROSCOPIC
Bacteria, UA: NONE SEEN
Bilirubin Urine: NEGATIVE
Glucose, UA: NEGATIVE mg/dL
Hgb urine dipstick: NEGATIVE
Ketones, ur: NEGATIVE mg/dL
Nitrite: NEGATIVE
Protein, ur: NEGATIVE mg/dL
Specific Gravity, Urine: 1.017 (ref 1.005–1.030)
pH: 5 (ref 5.0–8.0)

## 2022-06-06 MED ORDER — LIDOCAINE 5 % EX PTCH: 1.0000 | MEDICATED_PATCH | Freq: Two times a day (BID) | CUTANEOUS | 0 refills | Status: AC | PRN

## 2022-06-06 MED ORDER — KETOROLAC TROMETHAMINE 60 MG/2ML IM SOLN
60.0000 mg | Freq: Once | INTRAMUSCULAR | Status: AC
  Administered 2022-06-06: 60 mg via INTRAMUSCULAR
  Filled 2022-06-06: qty 2

## 2022-06-06 MED ORDER — TIZANIDINE HCL 2 MG PO TABS: 2.0000 mg | ORAL_TABLET | Freq: Three times a day (TID) | ORAL | 0 refills | Status: AC | PRN

## 2022-06-06 MED ORDER — ORPHENADRINE CITRATE 30 MG/ML IJ SOLN: 60.0000 mg | INTRAMUSCULAR | Status: DC

## 2022-06-06 MED ORDER — LIDOCAINE 5 % EX PTCH
1.0000 | MEDICATED_PATCH | Freq: Once | CUTANEOUS | Status: DC
  Administered 2022-06-06: 1 via TRANSDERMAL
  Filled 2022-06-06: qty 1

## 2022-06-06 MED ORDER — BACLOFEN 10 MG PO TABS
10.0000 mg | ORAL_TABLET | Freq: Once | ORAL | Status: AC
Start: 2022-06-06 — End: 2022-06-06
  Administered 2022-06-06: 10 mg via ORAL
  Filled 2022-06-06: qty 1
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
## 2022-06-06 NOTE — Discharge Instructions (Addendum)
Take the prescription meds as directed. Follow-up with your primary provider as needed. Your recent MRI results are negative, with NO signs of nerve impingement, herniated discs, ruptured discs, or arthritis.
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
## 2022-09-10 ENCOUNTER — Other Ambulatory Visit: Payer: Self-pay | Admitting: Nurse Practitioner

## 2022-09-10 DIAGNOSIS — R1013 Epigastric pain: Secondary | ICD-10-CM

## 2022-09-10 DIAGNOSIS — K760 Fatty (change of) liver, not elsewhere classified: Secondary | ICD-10-CM

## 2022-09-18 ENCOUNTER — Ambulatory Visit
Admission: RE | Admit: 2022-09-18 | Discharge: 2022-09-18 | Disposition: A | Discharge status: Home / Self Care | Payer: BC Managed Care – PPO | Source: Ambulatory Visit | Attending: Nurse Practitioner | Admitting: Nurse Practitioner

## 2022-09-18 DIAGNOSIS — R1013 Epigastric pain: Secondary | ICD-10-CM

## 2022-09-18 DIAGNOSIS — K760 Fatty (change of) liver, not elsewhere classified: Secondary | ICD-10-CM | POA: Diagnosis present

## 2022-11-27 ENCOUNTER — Other Ambulatory Visit: Payer: Self-pay | Admitting: Nurse Practitioner

## 2022-11-27 DIAGNOSIS — R1310 Dysphagia, unspecified: Secondary | ICD-10-CM

## 2022-12-17 ENCOUNTER — Ambulatory Visit
Admission: RE | Admit: 2022-12-17 | Discharge: 2022-12-17 | Disposition: A | Discharge status: Home / Self Care | Payer: BC Managed Care – PPO | Source: Ambulatory Visit | Attending: Nurse Practitioner | Admitting: Nurse Practitioner

## 2022-12-17 DIAGNOSIS — R1310 Dysphagia, unspecified: Secondary | ICD-10-CM | POA: Diagnosis present

## 2022-12-17 NOTE — Progress Notes (Signed)
Modified Barium Swallow Study  Patient Details  Name: Meredith Harris MRN: [id number] Date of Birth: Jan 06, 1976  Today's Date: 12/17/2022  Modified Barium Swallow completed.  Full report located under Chart Review in the Imaging Section.  History of Present Illness Pt is a 47 y.o. female with history of obesity, asthma, hidradenitis, GERD, hepatic steatosis (2019), EGD colonoscopy for history of dark stools x2 in Oct, intermittent upper epigastric abdominal pain, REFLUX and a "sensation that something is stuck in her throat". She also reported to her GI postprandial abdominal bloating and urgent bowel movement following meals.    She underwent EGD that did not explain her upper GI complaints. She is on Protonix taking it regularly for her Heartburn. She has the sensation of trouble swallowing saliva "sometimes". She has a spot in her throat that wants to "close off" when swallowing "sometimes", even w/ saliva.  Pt has no recent CXRs; no dx of pneumonia.  Pt denied any dxs of neuorological dis, no stroke history.  Pt is missing Dentition (many molars).   Clinical Impression Patient presents with functional oropharyngeal swallowing in setting of known dx of GERD, on a PPI and being followed by GI. No laryngeal penetration nor aspiration occurred during this study.  Oral stage is characterized by adequate lip closure, bolus preparation and containment, and anterior to posterior transit. Mastication was functional in setting of missing Dentition(molars) -- more anterior mastication time b/f A-P transfer. Swallow initiation noted to occur w/ thin liquids as they spilled to/from Valleculae (minimally). Complete airway closure and protection throughout trials.  Pharyngeal stage is noted for complete tongue base retraction, full hyolaryngeal excursion, and pharyngeal constriction. Epiglottic deflection is complete; No penetration nor aspiration occurred during any po trials. There was no pharyngeal residue  remaining post swallow. Pharyngeal stripping wave is complete. Amplitude/duration of cricopharyngeus opening is WFL. There is adequate/complete clearance through the cervical esophagus.   Consistencies tested were thin liquids x2 tsps, 2 cup sip, 3 sequential sips, nectar x1 tsp, 1 cup sip, 2 sequential cup sips, honey x1 tsp, pudding x1 tsp, regular solid (1/2 graham cracker with pudding).  Pt did not report any globus sensation nor trouble swallowing po trials during this assessment. Discussed her reports of globus - the "spot" where it "seems to close off sometimes" which she indicated to by pointing to the area of the thyroid notch. Discussed the impact of GERD/REFLUX on Esophageal phasey dsymotility and globus sensations in this area.  Recommend patient continue regular diet with thin liquids; keep a food diary as to when she feels the globus sensation in order to better share w/ GI/MD. Educated pt verbally on general REFLUX and aspiration precautions as well as on general strategies including moistening dry meats/foods, alternating solids and liquids, rest breaks during meals to allow for Esophageal clearing.  No further ST indicated. Recommend ongoing f/u w/ GI for GERD management.  Factors that may increase risk of adverse event in presence of aspiration Phineas Douglas & Padilla 2021):  (n/a)  Swallow Evaluation Recommendations Recommendations: PO diet PO Diet Recommendation: Regular;Thin liquids (Level 0) (moist, cut foods) Liquid Administration via: Cup Medication Administration: Whole meds with liquid Supervision: Patient able to self-feed Swallowing strategies  : Minimize environmental distractions;Slow rate;Small bites/sips;Follow solids with liquids Postural changes: Position pt fully upright for meals;Stay upright 30-60 min after meals (GERD precautions) Oral care recommendations: Oral care BID (2x/day);Pt independent with oral care Recommended consults: Consider GI consultation;Consider  esophageal assessment (ongoing f/u w/ GI) No further skilled ST  services         Orinda Kenner, MS, CCC-SLP Speech Language Pathologist Rehab Services; [place] [phone] (ascom) [name] 12/17/2022,5:17 PM

## 2023-03-10 ENCOUNTER — Other Ambulatory Visit: Payer: Self-pay

## 2023-03-10 DIAGNOSIS — M79604 Pain in right leg: Secondary | ICD-10-CM

## 2023-03-16 ENCOUNTER — Ambulatory Visit
Admission: RE | Admit: 2023-03-16 | Discharge: 2023-03-16 | Disposition: A | Discharge status: Home / Self Care | Payer: Medicaid Other | Source: Ambulatory Visit | Attending: Internal Medicine | Admitting: Internal Medicine

## 2023-03-16 DIAGNOSIS — M79604 Pain in right leg: Secondary | ICD-10-CM | POA: Diagnosis present

## 2023-03-16 DIAGNOSIS — M79605 Pain in left leg: Secondary | ICD-10-CM | POA: Insufficient documentation

## 2023-04-21 ENCOUNTER — Emergency Department
Admission: EM | Admit: 2023-04-21 | Discharge: 2023-04-21 | Disposition: A | Discharge status: Home / Self Care | Payer: Medicaid Other | Attending: Emergency Medicine | Admitting: Emergency Medicine

## 2023-04-21 ENCOUNTER — Emergency Department: Payer: Medicaid Other

## 2023-04-21 ENCOUNTER — Encounter: Payer: Self-pay | Admitting: Intensive Care

## 2023-04-21 ENCOUNTER — Other Ambulatory Visit: Payer: Self-pay

## 2023-04-21 DIAGNOSIS — R0602 Shortness of breath: Secondary | ICD-10-CM | POA: Diagnosis not present

## 2023-04-21 DIAGNOSIS — R001 Bradycardia, unspecified: Secondary | ICD-10-CM

## 2023-04-21 DIAGNOSIS — R61 Generalized hyperhidrosis: Secondary | ICD-10-CM | POA: Diagnosis not present

## 2023-04-21 DIAGNOSIS — R0789 Other chest pain: Secondary | ICD-10-CM | POA: Diagnosis present

## 2023-04-21 DIAGNOSIS — J45909 Unspecified asthma, uncomplicated: Secondary | ICD-10-CM | POA: Insufficient documentation

## 2023-04-21 LAB — TROPONIN I (HIGH SENSITIVITY)
Troponin I (High Sensitivity): 5 ng/L (ref <18)
Troponin I (High Sensitivity): 6 ng/L (ref <18)

## 2023-04-21 LAB — BASIC METABOLIC PANEL
Anion gap: 6 (ref 5–15)
BUN: 13 mg/dL (ref 6–20)
CO2: 25 mmol/L (ref 22–32)
Calcium: 8.8 mg/dL — ABNORMAL LOW (ref 8.9–10.3)
Chloride: 108 mmol/L (ref 98–111)
Creatinine, Ser: 1 mg/dL (ref 0.44–1.00)
GFR, Estimated: >60 mL/min (ref >60)
Glucose, Bld: 82 mg/dL (ref 70–99)
Potassium: 3.5 mmol/L (ref 3.5–5.1)
Sodium: 139 mmol/L (ref 135–145)

## 2023-04-21 LAB — CBC
HCT: 38.5 % (ref 36.0–46.0)
Hemoglobin: 13.4 g/dL (ref 12.0–15.0)
MCH: 30.2 pg (ref 26.0–34.0)
MCHC: 34.8 g/dL (ref 30.0–36.0)
MCV: 86.9 fL (ref 80.0–100.0)
Platelets: 280 10*3/uL (ref 150–400)
RBC: 4.43 MIL/uL (ref 3.87–5.11)
RDW: 12.3 % (ref 11.5–15.5)
WBC: 6.4 10*3/uL (ref 4.0–10.5)
nRBC: 0 % (ref 0.0–0.2)

## 2023-04-21 LAB — TSH: TSH: 0.934 u[IU]/mL (ref 0.350–4.500)

## 2023-04-21 LAB — T4, FREE: Free T4: 0.79 ng/dL (ref 0.61–1.12)

## 2023-04-21 LAB — MAGNESIUM: Magnesium: 2.2 mg/dL (ref 1.7–2.4)

## 2023-04-21 MED ORDER — FENTANYL CITRATE PF 50 MCG/ML IJ SOSY
50.0000 ug | PREFILLED_SYRINGE | Freq: Once | INTRAMUSCULAR | Status: AC
  Administered 2023-04-21: 50 ug via INTRAVENOUS
  Filled 2023-04-21: qty 1

## 2023-04-21 MED ORDER — IOHEXOL 350 MG/ML SOLN
75.0000 mL | Freq: Once | INTRAVENOUS | Status: AC | PRN
  Administered 2023-04-21: 75 mL via INTRAVENOUS

## 2023-04-21 MED ORDER — ONDANSETRON HCL 4 MG/2ML IJ SOLN
4.0000 mg | Freq: Once | INTRAMUSCULAR | Status: AC
  Administered 2023-04-21: 4 mg via INTRAVENOUS
  Filled 2023-04-21: qty 2

## 2023-04-21 NOTE — ED Triage Notes (Signed)
Patient reports while at work she started having heavy chest pressure. Reports being diaphoretic and sob.   A&O x4 at this time

## 2023-04-21 NOTE — ED Provider Notes (Signed)
[facility] Emergency Department Provider Note     Event Date/Time   First MD Initiated Contact with Patient 04/21/23 1414     (approximate)   History   Chest Pain   HPI  Meredith Harris is a 47 y.o. female with a history of secondary amenorrhea, hidradenitis, asthma, IBS, presents to the ED for evaluation of episode at work where she had a sudden onset of heavy central chest pressure and associated diaphoresis and shortness of breath.  Patient denies any syncope at that time.  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  Denies any strenuous activity prior to the onset of her symptoms.  She has a remote history of atypical chest pain with prior cardiac echo and stress test noting normal results from 2023.  She endorses chest pain currently at a 9 out of 10.  Patient denies any weakness, dizziness, paresthesias, or headache.   Physical Exam   Triage Vital Signs: ED Triage Vitals [04/21/23 1231]  Encounter Vitals Group     BP (!) 174/83     Systolic BP Percentile      Diastolic BP Percentile      Pulse Rate (!) 51     Resp 20     Temp 98.3 F (36.8 C)     Temp Source Oral     SpO2 100 %     Weight 205 lb (93 kg)     Height 5\' 4"  (1.626 m)     Head Circumference      Peak Flow      Pain Score 10     Pain Loc      Pain Education      Exclude from Growth Chart     Most recent vital signs: Vitals:   04/21/23 1730 04/21/23 1801  BP: (!) 156/91 (!) 156/91  Pulse: (!) 47 (!) 47  Resp:  17  Temp:  98.1 F (36.7 C)  SpO2: 100% 100%    General Awake, no distress. NAD HEENT NCAT. PERRL. EOMI. No rhinorrhea. Mucous membranes are moist.  CV:  Good peripheral perfusion. Sinus bradycardia. Normal S1S2. No MRGs RESP:  Normal effort. CTA ABD:  No distention.  NEURO: CN II-XII grossly intact   ED Results / Procedures / Treatments   Labs (all labs ordered are listed, but only abnormal results are  displayed) Labs Reviewed  BASIC METABOLIC PANEL - Abnormal; Notable for the following components:      Result Value   Calcium 8.8 (*)    All other components within normal limits  CBC  MAGNESIUM  TSH  T4, FREE  TROPONIN I (HIGH SENSITIVITY)  TROPONIN I (HIGH SENSITIVITY)     EKG  Vent. rate 49 BPM PR interval 152 ms QRS duration 70 ms QT/QTcB 450/406 ms P-R-T axes 42 47 -2 Sinus bradycardia Cannot rule out Anterior infarct (cited on or before 08-Dec-2018) Abnormal ECG When compared with ECG of 12-May-2019 20:14, Vent. rate has decreased BY 38 BPM ST no longer depressed in Inferior leads  RADIOLOGY  I personally viewed and evaluated these images as part of my medical decision making, as well as reviewing the written report by the radiologist.  ED Provider Interpretation: no acute findings  CT Angio Chest PE W and/or Wo Contrast  Result Date: 04/21/2023 CLINICAL DATA:  Chest pain, shortness of breath, high clinical suspicion for PE EXAM: CT ANGIOGRAPHY CHEST WITH CONTRAST TECHNIQUE: Multidetector CT imaging of  the chest was performed using the standard protocol during bolus administration of intravenous contrast. Multiplanar CT image reconstructions and MIPs were obtained to evaluate the vascular anatomy. RADIATION DOSE REDUCTION: This exam was performed according to the departmental dose-optimization program which includes automated exposure control, adjustment of the mA and/or kV according to patient size and/or use of iterative reconstruction technique. CONTRAST:  75mL OMNIPAQUE IOHEXOL 350 MG/ML SOLN COMPARISON:  Previous studies including the chest radiograph done today FINDINGS: Cardiovascular: There are no intraluminal filling defects in pulmonary artery branches. There is homogeneous enhancement in thoracic aorta. Mediastinum/Nodes: No significant lymphadenopathy is seen. Lungs/Pleura: There is no focal pulmonary consolidation. There is no pleural effusion or pneumothorax.  There is small pocket of air in a tubular structure in the anterior chest wall on the right side, possibly air in a venous structure introduced during venipuncture. Upper Abdomen: Surgical clips are seen in gallbladder fossa. Musculoskeletal: No acute findings are seen. Review of the MIP images confirms the above findings. IMPRESSION: There is no evidence of pulmonary embolism. There is no evidence of thoracic aortic dissection. There is no focal pulmonary consolidation. There is no pleural effusion or pneumothorax. Electronically Signed   By: Ernie Avena M.D.   On: 04/21/2023 17:17   DG Chest 2 View  Result Date: 04/21/2023 CLINICAL DATA:  cp EXAM: CHEST - 2 VIEW COMPARISON:  CXR 05/12/19 FINDINGS: No pleural effusion. No pneumothorax. No focal airspace opacity. Normal cardiac and mediastinal contours. No radiographically apparent displaced rib fractures. Visualized upper abdomen is unremarkable. Vertebral body heights are maintained. IMPRESSION: No focal airspace opacity Electronically Signed   By: Lorenza Cambridge M.D.   On: 04/21/2023 13:21     PROCEDURES:  Critical Care performed: No  Procedures   MEDICATIONS ORDERED IN ED: Medications  ondansetron (ZOFRAN) injection 4 mg (4 mg Intravenous Given 04/21/23 1636)  fentaNYL (SUBLIMAZE) injection 50 mcg (50 mcg Intravenous Given 04/21/23 1636)  iohexol (OMNIPAQUE) 350 MG/ML injection 75 mL (75 mLs Intravenous Contrast Given 04/21/23 1648)     IMPRESSION / MDM / ASSESSMENT AND PLAN / ED COURSE  I reviewed the triage vital signs and the nursing notes.                              Differential diagnosis includes, but is not limited to, ACS, aortic dissection, pulmonary embolism, cardiac tamponade, pneumothorax, pneumonia, pericarditis, myocarditis, GI-related causes including esophagitis/gastritis, and musculoskeletal chest wall pain.    Patient's presentation is most consistent with acute presentation with potential threat to life or  bodily function.  The patient is on the cardiac monitor to evaluate for evidence of arrhythmia and/or significant heart rate changes.  Patient's diagnosis is consistent with sinus bradycardia of an unclear etiology at this time.  Patient also presenting with the diagnosis of atypical chest pain, following a normal cardiac workup.  No lab abnormalities appreciated.  Troponin normal x 2.  No acute electrolyte abnormalities, leukocytosis, critical anemia.  EKG with confirm sinus bradycardia without evidence of malignant arrhythmia.  Chest ray without evidence of intrathoracic process.  Patient presenting with asymptomatic bradycardia at this time and endorsing central chest pressure.  She was further evaluated with a CT angio study which is negative for any acute intrathoracic findings including PE. Patient is to follow up with her PCP but has been given ambulatory referral to outpatient cardiology for further evaluation.  No indication for admission at this time as the patient is  asymptomatic with her new onset bradycardia with no evidence of ACS or AMI.  Patient is given ED precautions to return to the ED for any worsening or new symptoms.    FINAL CLINICAL IMPRESSION(S) / ED DIAGNOSES   Final diagnoses:  Bradycardia  Atypical chest pain     Rx / DC Orders   ED Discharge Orders          Ordered    Ambulatory referral to Cardiology       Comments: If you have not heard from the Cardiology office within the next 72 hours please call [phone].   04/21/23 1741             Note:  This document was prepared using Dragon voice recognition software and may include unintentional dictation errors.    Lissa Hoard, PA-C 04/21/23 2031    Minna Antis, MD 04/22/23 2251

## 2023-04-21 NOTE — Discharge Instructions (Addendum)
Your exam, labs, EKG, chest x-ray, and CT scan are negative for any acute findings.  No signs of any serious underlying cardiac cause for your symptoms, including a blood clot in the lungs.  You do have evidence of bradycardia (low heart rate) which does not appear to be symptomatic at this time. You should follow-up with your primary provider and cardiology as discussed. Return to the ED as needed.

## 2023-04-27 ENCOUNTER — Encounter: Payer: Self-pay | Admitting: Family Medicine

## 2023-04-27 ENCOUNTER — Ambulatory Visit: Payer: Medicaid Other | Admitting: Family Medicine

## 2023-04-27 DIAGNOSIS — Z113 Encounter for screening for infections with a predominantly sexual mode of transmission: Secondary | ICD-10-CM | POA: Diagnosis not present

## 2023-04-27 LAB — WET PREP FOR TRICH, YEAST, CLUE
Trichomonas Exam: NEGATIVE
Yeast Exam: NEGATIVE

## 2023-04-27 LAB — HM HIV SCREENING LAB: HM HIV Screening: NEGATIVE

## 2023-04-27 NOTE — Progress Notes (Signed)
[facility] Department  STI clinic/screening visit [address] [place] [address] [phone]  Subjective:  Meredith Harris is a 47 y.o. female being seen today for an STI screening visit. The patient reports they do have symptoms.  Patient reports that they do not desire a pregnancy in the next year.   They reported they are not interested in discussing contraception today.    Patient's last menstrual period was 12/10/2021 (approximate).  Patient has the following medical conditions:   Patient Active Problem List   Diagnosis Date Noted   Premature menopause 05/05/2022   BV (bacterial vaginosis) 05/05/2022   Amenorrhea 01/24/2020   Family history of ovarian cancer 01/24/2020   External hemorrhoid 01/24/2020   Hidradenitis 05/31/2018   Sepsis (HCC) 05/09/2018    No chief complaint on file.   HPI  Patient reports to clinic for STI testing.   Does the patient using douching products? Practitioner oversight- forgot to ask  Last HIV test per patient/review of record was  Lab Results  Component Value Date   HMHIVSCREEN Negative - Validated 10/11/2017    Lab Results  Component Value Date   HIV Non Reactive 05/10/2018   Patient reports last pap was  Lab Results  Component Value Date   DIAGPAP  01/24/2020    - Negative for intraepithelial lesion or malignancy (NILM)   No results found for: "SPECADGYN"  Screening for MPX risk: Does the patient have an unexplained rash? No Is the patient MSM? No Does the patient endorse multiple sex partners or anonymous sex partners? No Did the patient have close or sexual contact with a person diagnosed with MPX? No Has the patient traveled outside the [place] where MPX is endemic? No Is there a high clinical suspicion for MPX-- evidenced by one of the following No  -Unlikely to be chickenpox  -Lymphadenopathy  -Rash that present in same phase of evolution on any given body part See flowsheet for further details  and programmatic requirements.   Immunization history:   There is no immunization history on file for this patient.   The following portions of the patient's history were reviewed and updated as appropriate: allergies, current medications, past medical history, past social history, past surgical history and problem list.  Objective:  There were no vitals filed for this visit.  Physical Exam Vitals and nursing note reviewed.  Constitutional:      Appearance: Normal appearance.  HENT:     Head: Normocephalic and atraumatic.     Mouth/Throat:     Mouth: Mucous membranes are moist.     Pharynx: Oropharynx is clear. No oropharyngeal exudate or posterior oropharyngeal erythema.  Pulmonary:     Effort: Pulmonary effort is normal.  Abdominal:     General: Abdomen is flat.     Palpations: There is no mass.     Tenderness: There is no abdominal tenderness. There is no rebound.  Genitourinary:    General: Normal vulva.     Exam position: Lithotomy position.     Pubic Area: No rash or pubic lice.      Labia:        Right: No rash or lesion.        Left: No rash or lesion.      Vagina: Vaginal discharge present. No erythema, bleeding or lesions.     Cervix: No cervical motion tenderness, discharge, friability, lesion or erythema.     Uterus: Normal.      Adnexa: Right adnexa  normal and left adnexa normal.     Rectum: Normal.     Comments: pH = 5  -mild amt of yellowish discharge present Lymphadenopathy:     Head:     Right side of head: No preauricular or posterior auricular adenopathy.     Left side of head: No preauricular or posterior auricular adenopathy.     Cervical: No cervical adenopathy.     Upper Body:     Right upper body: No supraclavicular, axillary or epitrochlear adenopathy.     Left upper body: No supraclavicular, axillary or epitrochlear adenopathy.     Lower Body: No right inguinal adenopathy. No left inguinal adenopathy.  Skin:    General: Skin is warm and  dry.     Findings: No rash.  Neurological:     Mental Status: Meredith Harris is alert and oriented to person, place, and time.      Assessment and Plan:  Meredith Harris is a 47 y.o. female presenting to the [facility] Department for STI screening  1. Screening for venereal disease  - Chlamydia/Gonorrhea [place] [facility] - HIV [place] [facility] - Syphilis Serology,  Lab - WET PREP FOR TRICH, YEAST, CLUE   Patient accepted all screenings including  vaginal CT/GC and bloodwork for HIV/RPR, and wet prep. Patient meets criteria for HepB screening? No. Ordered? not applicable Patient meets criteria for HepC screening? No. Ordered? not applicable  Treat wet prep per standing order Discussed time line for [facility] results and that patient will be called with positive results and encouraged patient to call if Meredith Harris had not heard in 2 weeks.  Counseled to return or seek care for continued or worsening symptoms Recommended repeat testing in 3 months with positive results. Recommended condom use with all sex  Patient is currently using  postmenopausal  to prevent pregnancy.    Return if symptoms worsen or fail to improve, for STI screening.  Future Appointments  Date Time Provider Department Center  07/08/2023 10:00 AM Debbe Odea, MD [facility] None  Total time spent 20 minutes  Lenice Llamas, [place]

## 2023-04-27 NOTE — Progress Notes (Signed)
Pt here for STI screening.  Wet mount results reviewed with patient.  No treatment needed at this time as per standing orders.  Condoms Eloisa Northern, RN

## 2023-05-06 ENCOUNTER — Other Ambulatory Visit: Payer: Self-pay | Admitting: Internal Medicine

## 2023-05-06 DIAGNOSIS — Z1231 Encounter for screening mammogram for malignant neoplasm of breast: Secondary | ICD-10-CM

## 2023-06-04 ENCOUNTER — Ambulatory Visit
Admission: RE | Admit: 2023-06-04 | Discharge: 2023-06-04 | Disposition: A | Discharge status: Home / Self Care | Payer: Medicaid Other | Source: Ambulatory Visit | Attending: Internal Medicine | Admitting: Internal Medicine

## 2023-06-04 DIAGNOSIS — Z1231 Encounter for screening mammogram for malignant neoplasm of breast: Secondary | ICD-10-CM | POA: Insufficient documentation

## 2023-07-06 ENCOUNTER — Telehealth: Payer: Self-pay

## 2023-07-06 DIAGNOSIS — I714 Abdominal aortic aneurysm, without rupture, unspecified: Secondary | ICD-10-CM | POA: Insufficient documentation

## 2023-07-06 NOTE — Telephone Encounter (Signed)
Meredith Harris is scheduled as new patient at [facility].  Message left request patient to call office with any cardiac history.  If no history, no need to return call.

## 2023-07-08 ENCOUNTER — Ambulatory Visit: Payer: Medicaid Other | Attending: Cardiology | Admitting: Cardiology

## 2023-07-08 ENCOUNTER — Encounter: Payer: Self-pay | Admitting: Cardiology

## 2023-07-08 VITALS — BP 128/90 | HR 65 | Ht 65.0 in | Wt 209.9 lb

## 2023-07-08 DIAGNOSIS — R072 Precordial pain: Secondary | ICD-10-CM

## 2023-07-08 DIAGNOSIS — K219 Gastro-esophageal reflux disease without esophagitis: Secondary | ICD-10-CM

## 2023-07-08 MED ORDER — METOPROLOL TARTRATE 50 MG PO TABS: 50.0000 mg | ORAL_TABLET | Freq: Once | ORAL | 0 refills | Status: DC

## 2023-07-08 NOTE — Patient Instructions (Signed)
Medication Instructions:   Your physician recommends that you continue on your current medications as directed. Please refer to the Current Medication list given to you today.  *If you need a refill on your cardiac medications before your next appointment, please call your pharmacy*   Lab Work:  Your physician recommends labs today - BMP  If you have labs (blood work) drawn today and your tests are completely normal, you will receive your results only by: MyChart Message (if you have MyChart) OR A paper copy in the mail If you have any lab test that is abnormal or we need to change your treatment, we will call you to review the results.   Testing/Procedures:    Your cardiac CT will be scheduled at one of the below locations:   [facility] [address] [place], [place] [address] [phone]  OR   [facility] [address] [place], [place] [address] [phone]  Please follow these instructions carefully (unless otherwise directed):  An IV will be required for this test and Nitroglycerin will be given.  Hold all erectile dysfunction medications at least 3 days (72 hrs) prior to test. (Ie viagra, cialis, sildenafil, tadalafil, etc)   On the Night Before the Test: Be sure to Drink plenty of water. Do not consume any caffeinated/decaffeinated beverages or chocolate 12 hours prior to your test. Do not take any antihistamines 12 hours prior to your test.  On the Day of the Test: Drink plenty of water until 1 hour prior to the test. Do not eat any food 1 hour prior to test. You may take your regular medications prior to the test.  Take metoprolol (Lopressor) two hours prior to test. FEMALES- please wear underwire-free bra if available, avoid dresses & tight clothing       After the Test: Drink plenty of water. After receiving IV contrast, you may experience a mild flushed feeling. This is  normal. On occasion, you may experience a mild rash up to 24 hours after the test. This is not dangerous. If this occurs, you can take Benadryl 25 mg and increase your fluid intake. If you experience trouble breathing, this can be serious. If it is severe call 911 IMMEDIATELY. If it is mild, please call our office. If you take any of these medications: Glipizide/Metformin, Avandament, Glucavance, please do not take 48 hours after completing test unless otherwise instructed.  We will call to schedule your test 2-4 weeks out understanding that some insurance companies will need an authorization prior to the service being performed.   For more information and frequently asked questions, please visit our website : [email or web address]  For non-scheduling related questions, please contact the cardiac imaging nurse navigator should you have any questions/concerns: Cardiac Imaging Nurse Navigators Direct Office Dial: [phone]   For scheduling needs, including cancellations and rescheduling, please call [place], [phone].    Follow-Up: At [facility], you and your health needs are our priority.  As part of our continuing mission to provide you with exceptional heart care, we have created designated Provider Care Teams.  These Care Teams include your primary Cardiologist (physician) and Advanced Practice Providers (APPs -  Physician Assistants and Nurse Practitioners) who all work together to provide you with the care you need, when you need it.  We recommend signing up for the patient portal called "MyChart".  Sign up information is provided on this After Visit Summary.  MyChart is used to connect with  patients for Virtual Visits (Telemedicine).  Patients are able to view lab/test results, encounter notes, upcoming appointments, etc.  Non-urgent messages can be sent to your provider as well.   To learn more about what you can do with MyChart, go to [email or web address].     Your next appointment:    6-8 weeks  Provider:   You may see Debbe Odea, MD or one of the following Advanced Practice Providers on your designated Care Team:   Nicolasa Ducking, NP Eula Listen, PA-C Cadence Fransico Michael, PA-C Charlsie Quest, NP

## 2023-07-08 NOTE — Progress Notes (Signed)
Cardiology Office Note:    Date:  07/08/2023   ID:  Meredith Harris, DOB [birth year], MRN [id number]  PCP:  Enid Baas, MD   [facility] Providers Cardiologist:  Debbe Odea, MD     Referring MD: Enid Baas, MD   Chief Complaint  Patient presents with   New Patient (Initial Visit)    Referred for cardiac evaluation of bradycardia with atypical chest pain.  Patient intermittent left side chest tightness occasional radiating to upper left arm.  Last episode 1 wee ago.  Previous cardiac work up at [facility] including Echo complete and exercise stress test only.      History of Present Illness:    Meredith Harris is a 47 y.o. female with a hx of GERD who presents with chest pain.  States having symptoms of chest pain about a month and a half ago while sitting.  Describes discomfort as pressure-like sensation in the center of her chest associated with sweatiness.  Presented to the ED where workup with EKG and troponins was unrevealing.  Denies any family history of heart attacks.  Symptoms were different from her typical reflux symptoms.  GERD is adequately controlled with Protonix.  Has had occasional left-sided chest pain since the ER incident.  Outside echo 11/2021 [facility] EF>55%, normal RV function  Past Medical History:  Diagnosis Date   Asthma    BRCA negative 01/2020   MyRisk neg; IBIS=10.8%   External hemorrhoid    Family history of breast cancer    Family history of ovarian cancer    GERD (gastroesophageal reflux disease)    IBS (irritable bowel syndrome)    Lyme disease     Past Surgical History:  Procedure Laterality Date   CHOLECYSTECTOMY     TUBAL LIGATION     WOUND DEBRIDEMENT Bilateral 05/27/2020   Procedure: DEBRIDEMENT WOUND;  Surgeon: Earline Mayotte, MD;  Location: [facility] ORS;  Service: General;  Laterality: Bilateral;  debridement hidradenitis right axilla and left chest wall    Current Medications: Current  Meds  Medication Sig   albuterol (PROVENTIL HFA;VENTOLIN HFA) 108 (90 Base) MCG/ACT inhaler Inhale 2 puffs into the lungs every 6 (six) hours as needed for wheezing or shortness of breath.   cholecalciferol (VITAMIN D3) 25 MCG (1000 UNIT) tablet Take 1,000 Units by mouth daily.   magnesium oxide (MAG-OX) 400 (240 Mg) MG tablet Take 400 mg by mouth daily.   metoprolol tartrate (LOPRESSOR) 50 MG tablet Take 1 tablet (50 mg total) by mouth once for 1 dose. TWO HOURS PRIOR TO CARDIAC CT   pantoprazole (PROTONIX) 40 MG tablet Take 40 mg by mouth daily.     Allergies:   Penicillin g, Sulfa antibiotics, Morphine and codeine, and Penicillins   Social History   Socioeconomic History   Marital status: Single    Spouse name: Not on file   Number of children: Not on file   Years of education: Not on file   Highest education level: Not on file  Occupational History   Not on file  Tobacco Use   Smoking status: Former    Types: Cigarettes   Smokeless tobacco: Never   Tobacco comments:    Smoked for 2 years.  Vaping Use   Vaping status: Never Used  Substance and Sexual Activity   Alcohol use: Yes    Comment: socially   Drug use: Never   Sexual activity: Yes    Partners: Male    Birth control/protection: Surgical  Comment: Tubal Ligation  Other Topics Concern   Not on file  Social History Narrative   Not on file   Social Determinants of Health   Financial Resource Strain: Low Risk  (05/07/2023)   Received from [facility]   Overall Financial Resource Strain (CARDIA)    Difficulty of Paying Living Expenses: Not hard at all  Food Insecurity: No Food Insecurity (05/07/2023)   Received from [facility]   Hunger Vital Sign    Worried About Running Out of Food in the Last Year: Never true    Ran Out of Food in the Last Year: Never true  Transportation Needs: No Transportation Needs (05/07/2023)   Received from [facility]    In the past 12 months, has lack of transportation kept you from medical appointments or from getting medications?: No    Lack of Transportation (Non-Medical): No  Physical Activity: Not on file  Stress: Not on file  Social Connections: Not on file     Family History: The patient's family history includes Breast cancer (age of onset: 63) in her maternal aunt; Heart disease in her father; Hypertension in her father and mother; Ovarian cancer (age of onset: 40) in her sister; Stroke in her mother.  ROS:   Please see the history of present illness.     All other systems reviewed and are negative.  EKGs/Labs/Other Studies Reviewed:    The following studies were reviewed today:  EKG Interpretation Date/Time:  Thursday July 08 2023 10:26:54 EDT Ventricular Rate:  65 PR Interval:  156 QRS Duration:  74 QT Interval:  432 QTC Calculation: 449 R Axis:   19  Text Interpretation: Normal sinus rhythm Nonspecific T wave abnormality Confirmed by Debbe Odea ([id number]) on 07/08/2023 10:37:31 AM    Recent Labs: 04/21/2023: BUN 13; Creatinine, Ser 1.00; Hemoglobin 13.4; Magnesium 2.2; Platelets 280; Potassium 3.5; Sodium 139; TSH 0.934  Recent Lipid Panel No results found for: "CHOL", "TRIG", "HDL", "CHOLHDL", "VLDL", "LDLCALC", "LDLDIRECT"   Risk Assessment/Calculations:    HYPERTENSION CONTROL Vitals:   07/08/23 1020 07/08/23 1028  BP: 122/88 (!) 128/90    The patient's blood pressure is elevated above target today.  In order to address the patient's elevated BP: Blood pressure will be monitored at home to determine if medication changes need to be made.            Physical Exam:    VS:  BP (!) 128/90 (BP Location: Right Arm, Patient Position: Sitting, Cuff Size: Large)   Pulse 65   Ht 5\' 5"  (1.651 m)   Wt 209 lb 14.4 oz (95.2 kg)   LMP 12/10/2021 (Approximate)   SpO2 100%   BMI 34.93 kg/m     Wt Readings from Last 3 Encounters:  07/08/23 209 lb  14.4 oz (95.2 kg)  04/21/23 205 lb (93 kg)  06/06/22 210 lb (95.3 kg)     GEN:  Well nourished, well developed in no acute distress HEENT: Normal NECK: No JVD; No carotid bruits CARDIAC: RRR, no murmurs, rubs, gallops RESPIRATORY:  Clear to auscultation without rales, wheezing or rhonchi  ABDOMEN: Soft, non-tender, non-distended MUSCULOSKELETAL:  No edema; No deformity  SKIN: Warm and dry NEUROLOGIC:  Alert and oriented x 3 PSYCHIATRIC:  Normal affect   ASSESSMENT:    1. Precordial pain   2. Gastroesophageal reflux disease, unspecified whether esophagitis present    PLAN:    In order of problems  listed above:  Chest pain, echo 3/24 normal systolic and diastolic function EF 55%. obtain coronary CTA. History of GERD, managed with Protonix, continue Protonix.  Follow-up after coronary CTA.     Medication Adjustments/Labs and Tests Ordered: Current medicines are reviewed at length with the patient today.  Concerns regarding medicines are outlined above.  Orders Placed This Encounter  Procedures   CT CORONARY MORPH W/CTA COR W/SCORE W/CA W/CM &/OR WO/CM   Basic Metabolic Panel (BMET)   EKG 12-Lead   Meds ordered this encounter  Medications   metoprolol tartrate (LOPRESSOR) 50 MG tablet    Sig: Take 1 tablet (50 mg total) by mouth once for 1 dose. TWO HOURS PRIOR TO CARDIAC CT    Dispense:  1 tablet    Refill:  0    Patient Instructions  Medication Instructions:   Your physician recommends that you continue on your current medications as directed. Please refer to the Current Medication list given to you today.  *If you need a refill on your cardiac medications before your next appointment, please call your pharmacy*   Lab Work:  Your physician recommends labs today - BMP  If you have labs (blood work) drawn today and your tests are completely normal, you will receive your results only by: MyChart Message (if you have MyChart) OR A paper copy in the mail If you  have any lab test that is abnormal or we need to change your treatment, we will call you to review the results.   Testing/Procedures:    Your cardiac CT will be scheduled at one of the below locations:   [facility] [address] [place], [place] [address] [phone]  OR   [facility] [address] [place], [place] [address] [phone]  Please follow these instructions carefully (unless otherwise directed):  An IV will be required for this test and Nitroglycerin will be given.  Hold all erectile dysfunction medications at least 3 days (72 hrs) prior to test. (Ie viagra, cialis, sildenafil, tadalafil, etc)   On the Night Before the Test: Be sure to Drink plenty of water. Do not consume any caffeinated/decaffeinated beverages or chocolate 12 hours prior to your test. Do not take any antihistamines 12 hours prior to your test.  On the Day of the Test: Drink plenty of water until 1 hour prior to the test. Do not eat any food 1 hour prior to test. You may take your regular medications prior to the test.  Take metoprolol (Lopressor) two hours prior to test. FEMALES- please wear underwire-free bra if available, avoid dresses & tight clothing       After the Test: Drink plenty of water. After receiving IV contrast, you may experience a mild flushed feeling. This is normal. On occasion, you may experience a mild rash up to 24 hours after the test. This is not dangerous. If this occurs, you can take Benadryl 25 mg and increase your fluid intake. If you experience trouble breathing, this can be serious. If it is severe call 911 IMMEDIATELY. If it is mild, please call our office. If you take any of these medications: Glipizide/Metformin, Avandament, Glucavance, please do not take 48 hours after completing test unless otherwise instructed.  We will call to schedule your test 2-4 weeks out understanding that  some insurance companies will need an authorization prior to the service being performed.   For more information and frequently asked questions, please visit our website :  [email or web address]  For non-scheduling related questions, please contact the cardiac imaging nurse navigator should you have any questions/concerns: Cardiac Imaging Nurse Navigators Direct Office Dial: [phone]   For scheduling needs, including cancellations and rescheduling, please call [place], [phone].    Follow-Up: At [facility], you and your health needs are our priority.  As part of our continuing mission to provide you with exceptional heart care, we have created designated Provider Care Teams.  These Care Teams include your primary Cardiologist (physician) and Advanced Practice Providers (APPs -  Physician Assistants and Nurse Practitioners) who all work together to provide you with the care you need, when you need it.  We recommend signing up for the patient portal called "MyChart".  Sign up information is provided on this After Visit Summary.  MyChart is used to connect with patients for Virtual Visits (Telemedicine).  Patients are able to view lab/test results, encounter notes, upcoming appointments, etc.  Non-urgent messages can be sent to your provider as well.   To learn more about what you can do with MyChart, go to [email or web address].    Your next appointment:    6-8 weeks  Provider:   You may see Debbe Odea, MD or one of the following Advanced Practice Providers on your designated Care Team:   Nicolasa Ducking, NP Eula Listen, PA-C Cadence Fransico Michael, PA-C Charlsie Quest, NP   Signed, Debbe Odea, MD  07/08/2023 11:44 AM    [facility]

## 2023-07-09 LAB — BASIC METABOLIC PANEL
BUN/Creatinine Ratio: 10 (ref 9–23)
BUN: 10 mg/dL (ref 6–24)
CO2: 25 mmol/L (ref 20–29)
Calcium: 8.9 mg/dL (ref 8.7–10.2)
Chloride: 106 mmol/L (ref 96–106)
Creatinine, Ser: 1.03 mg/dL — ABNORMAL HIGH (ref 0.57–1.00)
Glucose: 81 mg/dL (ref 70–99)
Potassium: 4.5 mmol/L (ref 3.5–5.2)
Sodium: 145 mmol/L — ABNORMAL HIGH (ref 134–144)
eGFR: 68 mL/min/{1.73_m2} (ref >59)

## 2023-07-18 NOTE — Progress Notes (Unsigned)
PCP:  Enid Baas, MD   No chief complaint on file.    HPI:      Ms. Meredith Harris is a 47 y.o. No obstetric history on file. who LMP was Patient's last menstrual period was 12/10/2021 (approximate)., presents today for her annual examination.  Her menses had been absent since 5/20. Did not have withdrawal bleed with provera 11/22; FSH/estradiol 11/22 suggestive of premature menopause. Pt had mild vasomotor sx. Started on HRT due to age/sx/bone and CV benefits. Pt did well, Rx ran out 3 wks ago. Did have 7 days period with mod to heavy flow 6/23; no other vaginal bleeding/spotting. Had dysmen with bleeding. Felt like real period.  Hx of menorrhagia/leio, controlled with depo years ago.    Sex activity: single partner, occasionally; contraception - tubal ligation. No pain/bleeding/dryness.  Last Pap: 01/24/20 Results were normal/neg HPV DNA. S/p cryotx age 32; normal paps since Hx of STDs: HPV with cryo  Was diagnosed with BV with PCP a couple wks ago. Had increased vag d/c and odor, no irritation. Treated with metrogel and sx resolved. Starting to have increased d/c again for a couple days but no odor/irritation.   Last mammogram: 06/04/23; Results were normal, repeat in 12 months; s/p LT breast abscess 6/21. Has been having intermittent LT breast pains for the past few months. Sx last about 10 min then resolve. No masses. No related to bleeding 6/23. Drinks minimal to no caffeine.  There is a FH of breast cancer in her mat aunt, genetic testing not indicated. There is a FH of ovarian cancer in her pat 1/2 sister. Pt is MyRisk neg 2021; IBIS=10.8%. The patient does do self-breast exams.  Tobacco use: none Alcohol use: none No drug use.  Exercise: moderately active  She does get adequate calcium and Vitamin D in her diet.  Labs with PCP   Past Medical History:  Diagnosis Date   Asthma    BRCA negative 01/2020   MyRisk neg; IBIS=10.8%   External hemorrhoid    Family  history of breast cancer    Family history of ovarian cancer    GERD (gastroesophageal reflux disease)    IBS (irritable bowel syndrome)    Lyme disease     Past Surgical History:  Procedure Laterality Date   CHOLECYSTECTOMY     TUBAL LIGATION     WOUND DEBRIDEMENT Bilateral 05/27/2020   Procedure: DEBRIDEMENT WOUND;  Surgeon: Earline Mayotte, MD;  Location: [facility] ORS;  Service: General;  Laterality: Bilateral;  debridement hidradenitis right axilla and left chest wall    Family History  Problem Relation Age of Onset   Hypertension Mother    Stroke Mother    Heart disease Father    Hypertension Father    Ovarian cancer Sister 59       1/2 sister on pat side   Breast cancer Maternal Aunt 2    Social History   Socioeconomic History   Marital status: Single    Spouse name: Not on file   Number of children: Not on file   Years of education: Not on file   Highest education level: Not on file  Occupational History   Not on file  Tobacco Use   Smoking status: Former    Types: Cigarettes   Smokeless tobacco: Never   Tobacco comments:    Smoked for 2 years.  Vaping Use   Vaping status: Never Used  Substance and Sexual Activity   Alcohol use: Yes  Comment: socially   Drug use: Never   Sexual activity: Yes    Partners: Male    Birth control/protection: Surgical    Comment: Tubal Ligation  Other Topics Concern   Not on file  Social History Narrative   Not on file   Social Determinants of Health   Financial Resource Strain: Low Risk  (05/07/2023)   Received from [facility]   Overall Financial Resource Strain (CARDIA)    Difficulty of Paying Living Expenses: Not hard at all  Food Insecurity: No Food Insecurity (05/07/2023)   [facility]   Hunger Vital Sign    Worried About Running Out of Food in the Last Year: Never true    Ran Out of Food in the Last Year: Never true  Transportation Needs: No Transportation  Needs (05/07/2023)   Received from [facility] - Transportation    In the past 12 months, has lack of transportation kept you from medical appointments or from getting medications?: No    Lack of Transportation (Non-Medical): No  Physical Activity: Not on file  Stress: Not on file  Social Connections: Not on file  Intimate Partner Violence: Not on file     Current Outpatient Medications:    albuterol (PROVENTIL HFA;VENTOLIN HFA) 108 (90 Base) MCG/ACT inhaler, Inhale 2 puffs into the lungs every 6 (six) hours as needed for wheezing or shortness of breath., Disp: 1 Inhaler, Rfl: 0   cholecalciferol (VITAMIN D3) 25 MCG (1000 UNIT) tablet, Take 1,000 Units by mouth daily., Disp: , Rfl:    magnesium oxide (MAG-OX) 400 (240 Mg) MG tablet, Take 400 mg by mouth daily., Disp: , Rfl:    metoprolol tartrate (LOPRESSOR) 50 MG tablet, Take 1 tablet (50 mg total) by mouth once for 1 dose. TWO HOURS PRIOR TO CARDIAC CT, Disp: 1 tablet, Rfl: 0   pantoprazole (PROTONIX) 40 MG tablet, Take 40 mg by mouth daily., Disp: , Rfl:      ROS:  Review of Systems  Constitutional:  Negative for fatigue, fever and unexpected weight change.  Respiratory:  Negative for cough, shortness of breath and wheezing.   Cardiovascular:  Negative for chest pain, palpitations and leg swelling.  Gastrointestinal:  Negative for blood in stool, constipation, diarrhea, nausea and vomiting.  Endocrine: Negative for cold intolerance, heat intolerance and polyuria.  Genitourinary:  Negative for dyspareunia, dysuria, flank pain, frequency, genital sores, hematuria, menstrual problem, pelvic pain, urgency, vaginal bleeding, vaginal discharge and vaginal pain.  Musculoskeletal:  Negative for back pain, joint swelling and myalgias.  Skin:  Negative for rash.  Neurological:  Negative for dizziness, syncope, light-headedness, numbness and headaches.  Hematological:  Negative for adenopathy.   Psychiatric/Behavioral:  Negative for agitation, confusion, sleep disturbance and suicidal ideas. The patient is not nervous/anxious.   BREAST: No symptoms   Objective: LMP 12/10/2021 (Approximate)    Physical Exam Constitutional:      Appearance: She is well-developed.  Genitourinary:     Vulva normal.     Right Labia: No rash, tenderness or lesions.    Left Labia: No tenderness, lesions or rash.    No vaginal discharge, erythema or tenderness.      Right Adnexa: not tender and no mass present.    Left Adnexa: not tender and no mass present.    No cervical friability or polyp.     Uterus is not enlarged or tender.  Breasts:    Right: No mass, nipple  discharge, skin change or tenderness.     Left: Tenderness present. No mass, nipple discharge or skin change.  Neck:     Thyroid: No thyromegaly.  Cardiovascular:     Rate and Rhythm: Normal rate and regular rhythm.     Heart sounds: Normal heart sounds. No murmur heard. Pulmonary:     Effort: Pulmonary effort is normal.     Breath sounds: Normal breath sounds.  Chest:    Abdominal:     Palpations: Abdomen is soft.     Tenderness: There is no abdominal tenderness. There is no guarding or rebound.  Musculoskeletal:        General: Normal range of motion.     Cervical back: Normal range of motion.  Lymphadenopathy:     Cervical: No cervical adenopathy.  Neurological:     General: No focal deficit present.     Mental Status: She is alert and oriented to person, place, and time.     Cranial Nerves: No cranial nerve deficit.  Skin:    General: Skin is warm and dry.  Psychiatric:        Mood and Affect: Mood normal.        Behavior: Behavior normal.        Thought Content: Thought content normal.        Judgment: Judgment normal.  Vitals reviewed.    Results for orders placed or performed in visit on 05/05/22 (from the past 24 hour(s))  POCT Wet Prep with KOH     Status: Abnormal   Collection Time: 05/05/22  4:36 PM   Result Value Ref Range   Trichomonas, UA Negative    Clue Cells Wet Prep HPF POC neg    Epithelial Wet Prep HPF POC     Yeast Wet Prep HPF POC neg    Bacteria Wet Prep HPF POC     RBC Wet Prep HPF POC     WBC Wet Prep HPF POC     KOH Prep POC negative Negative     Assessment/Plan: Encounter for annual routine gynecological examination  Encounter for screening mammogram for malignant neoplasm of breast - Plan: MM 3D SCREEN BREAST BILATERAL, MM DIAG BREAST TOMO BILATERAL, US BREAST LTD UNI LEFT INC AXILLA, US BREAST LTD UNI RIGHT INC AXILLA;   Breast pain, left - Plan: MM 3D SCREEN BREAST BILATERAL, MM DIAG BREAST TOMO BILATERAL, US BREAST LTD UNI LEFT INC AXILLA, US BREAST LTD UNI RIGHT INC AXILLA; LT breast for several months; check dx mammo and u/s. Will f/u with results. Neg exam for masses.   Premature menopause - Plan: Estradiol-Norethindrone Acet 0.5-0.1 MG tablet; Rx RF HRT.   Vasomotor symptoms due to menopause - Plan: Estradiol-Norethindrone Acet 0.5-0.1 MG tablet  Vaginal discharge - Plan: POCT Wet Prep with KOH; neg wet prep. Hx of BV a couple wks ago. F/u if odor develops and will RF flagyl.    No orders of the defined types were placed in this encounter.           GYN counsel breast self exam, mammography screening, menopause, adequate intake of calcium and vitamin D, diet and exercise     F/U  No follow-ups on file.  Shelly Shoultz B. Buddie Marston, PA-C 07/18/2023 5:23 PM

## 2023-07-19 ENCOUNTER — Other Ambulatory Visit (HOSPITAL_COMMUNITY)
Admission: RE | Admit: 2023-07-19 | Discharge: 2023-07-19 | Disposition: A | Discharge status: Home / Self Care | Payer: Medicaid Other | Source: Ambulatory Visit | Attending: Obstetrics and Gynecology | Admitting: Obstetrics and Gynecology

## 2023-07-19 ENCOUNTER — Encounter: Payer: Self-pay | Admitting: Obstetrics and Gynecology

## 2023-07-19 ENCOUNTER — Ambulatory Visit (INDEPENDENT_AMBULATORY_CARE_PROVIDER_SITE_OTHER): Payer: Medicaid Other | Admitting: Obstetrics and Gynecology

## 2023-07-19 VITALS — BP 145/80 | HR 64 | Ht 65.0 in | Wt 213.0 lb

## 2023-07-19 DIAGNOSIS — Z124 Encounter for screening for malignant neoplasm of cervix: Secondary | ICD-10-CM

## 2023-07-19 DIAGNOSIS — Z1151 Encounter for screening for human papillomavirus (HPV): Secondary | ICD-10-CM | POA: Insufficient documentation

## 2023-07-19 DIAGNOSIS — Z01419 Encounter for gynecological examination (general) (routine) without abnormal findings: Secondary | ICD-10-CM

## 2023-07-19 DIAGNOSIS — Z780 Asymptomatic menopausal state: Secondary | ICD-10-CM

## 2023-07-19 DIAGNOSIS — Z01411 Encounter for gynecological examination (general) (routine) with abnormal findings: Secondary | ICD-10-CM

## 2023-07-19 DIAGNOSIS — N926 Irregular menstruation, unspecified: Secondary | ICD-10-CM

## 2023-07-19 DIAGNOSIS — G8929 Other chronic pain: Secondary | ICD-10-CM

## 2023-07-19 DIAGNOSIS — E28319 Asymptomatic premature menopause: Secondary | ICD-10-CM

## 2023-07-19 DIAGNOSIS — Z1231 Encounter for screening mammogram for malignant neoplasm of breast: Secondary | ICD-10-CM

## 2023-07-19 DIAGNOSIS — R350 Frequency of micturition: Secondary | ICD-10-CM

## 2023-07-19 DIAGNOSIS — N951 Menopausal and female climacteric states: Secondary | ICD-10-CM

## 2023-07-19 DIAGNOSIS — N898 Other specified noninflammatory disorders of vagina: Secondary | ICD-10-CM

## 2023-07-19 LAB — POCT URINALYSIS DIPSTICK
Bilirubin, UA: NEGATIVE
Blood, UA: NEGATIVE
Glucose, UA: NEGATIVE
Ketones, UA: NEGATIVE
Nitrite, UA: NEGATIVE
Protein, UA: NEGATIVE
Spec Grav, UA: 1.015 (ref 1.010–1.025)
pH, UA: 6 (ref 5.0–8.0)

## 2023-07-19 NOTE — Patient Instructions (Addendum)
I value your feedback and you entrusting us with your care. If you get a [facility] patient survey, I would appreciate you taking the time to let us know about your experience today. Thank you!  HEALTHY VAGINAL HYGIENE  AVOID   Panytyhose Synthetic underwear (wear COTTON underwear)  Tight pants/jeans Thongs Pantyliners Scented soaps/shower gels (use Dove Sensitive Skin soap or water to clean) Bubble bath/bath bombs Scented detergents  ALL dryer sheets (line dry underwear if using them on your other clothing) Feminine sprays/douches   FOR RECURRENT BACTERIAL VAGINOSIS (BV) Above recommendations and ADD probiotics daily, USE CONDOMS  Visit [email or web address]    

## 2023-07-20 ENCOUNTER — Encounter (HOSPITAL_COMMUNITY): Payer: Self-pay

## 2023-07-20 LAB — FSH/LH
FSH: 35.8 m[IU]/mL
LH: 26.6 m[IU]/mL

## 2023-07-20 LAB — PROGESTERONE: Progesterone: 0.1 ng/mL

## 2023-07-20 LAB — ESTRADIOL: Estradiol: 16.6 pg/mL

## 2023-07-20 NOTE — Addendum Note (Signed)
Addended by: Althea Grimmer B on: 07/20/2023 04:38 PM   Modules accepted: Orders

## 2023-07-20 NOTE — Progress Notes (Signed)
Pls schedule at [facility] and I"ll call with results. Thx.

## 2023-07-21 ENCOUNTER — Telehealth (HOSPITAL_COMMUNITY): Payer: Self-pay | Admitting: *Deleted

## 2023-07-21 NOTE — Telephone Encounter (Signed)
Reaching out to patient to offer assistance regarding upcoming cardiac imaging study; pt verbalizes understanding of appt date/time, parking situation and where to check in, pre-test NPO status and medications ordered, and verified current allergies; name and call back number provided for further questions should they arise Hayley Sharpe RN Navigator Cardiac Imaging [facility] [phone] office [phone] cell  

## 2023-07-22 ENCOUNTER — Ambulatory Visit
Admission: RE | Admit: 2023-07-22 | Discharge: 2023-07-22 | Disposition: A | Discharge status: Home / Self Care | Payer: Medicaid Other | Source: Ambulatory Visit | Attending: Cardiology | Admitting: Cardiology

## 2023-07-22 DIAGNOSIS — R072 Precordial pain: Secondary | ICD-10-CM | POA: Diagnosis present

## 2023-07-22 LAB — NUSWAB VAGINITIS (VG)
Candida albicans, NAA: NEGATIVE
Candida glabrata, NAA: NEGATIVE
Trich vag by NAA: NEGATIVE

## 2023-07-22 LAB — URINE CULTURE

## 2023-07-22 MED ORDER — IOHEXOL 350 MG/ML SOLN
100.0000 mL | Freq: Once | INTRAVENOUS | Status: AC | PRN
  Administered 2023-07-22: 100 mL via INTRAVENOUS

## 2023-07-22 MED ORDER — NITROGLYCERIN 0.4 MG SL SUBL
0.8000 mg | SUBLINGUAL_TABLET | Freq: Once | SUBLINGUAL | Status: AC
  Administered 2023-07-22: 0.8 mg via SUBLINGUAL

## 2023-07-22 MED ORDER — SODIUM CHLORIDE 0.9 % IV SOLN: INTRAVENOUS | Status: DC

## 2023-07-22 NOTE — Progress Notes (Signed)
Patient tolerated CT well. Drank water after. Vital signs stable encourage to drink water throughout day.Reasons explained and verbalized understanding.  

## 2023-07-23 NOTE — Telephone Encounter (Signed)
The patient contacted the office and is aware of call to scheduled for ultrasound

## 2023-07-27 LAB — CYTOLOGY - PAP
Comment: NEGATIVE
Diagnosis: NEGATIVE
Diagnosis: REACTIVE
High risk HPV: NEGATIVE

## 2023-08-04 ENCOUNTER — Ambulatory Visit
Admission: RE | Admit: 2023-08-04 | Discharge: 2023-08-04 | Disposition: A | Discharge status: Home / Self Care | Payer: Medicaid Other | Source: Ambulatory Visit | Attending: Obstetrics and Gynecology | Admitting: Obstetrics and Gynecology

## 2023-08-04 DIAGNOSIS — N926 Irregular menstruation, unspecified: Secondary | ICD-10-CM | POA: Diagnosis present

## 2023-08-04 DIAGNOSIS — R1031 Right lower quadrant pain: Secondary | ICD-10-CM | POA: Diagnosis present

## 2023-08-04 DIAGNOSIS — G8929 Other chronic pain: Secondary | ICD-10-CM | POA: Diagnosis present

## 2023-08-13 ENCOUNTER — Telehealth: Payer: Self-pay

## 2023-08-13 NOTE — Telephone Encounter (Signed)
Pt returning ABC call, I don't see a message for her. Are you aware of a message for this pt?

## 2023-08-16 NOTE — Telephone Encounter (Signed)
Patient returned call about u/s results. Advised Helmut Muster is currently seeing patients. Message sent for her review.

## 2023-08-16 NOTE — Telephone Encounter (Signed)
Done on separate message.

## 2023-08-18 NOTE — Progress Notes (Unsigned)
Meredith Baas, MD   No chief complaint on file.   HPI:      Meredith Harris is a 47 y.o. [id number] whose LMP was Patient's last menstrual period was 12/10/2021 (approximate)., presents today for EMB due to PMB vs perimenopausal bleeding. Having more bleeding/spotting since 10/24 appt. FSH/estradiol suggestive of menopause 10/24, GYN u/s 11/24 with EM=7 mm and 2 small leio.   07/19/23 NOTE: Her menses had been absent since 5/20. Did not have withdrawal bleed with provera 11/22; FSH/estradiol 11/22 suggestive of premature menopause. Started on HRT due to age/sx/bone and CV benefits 2022. Pt did well, stopped Rx about a year ago. Did have 7 day period with mod to heavy flow 6/23; has had random light spotting/bleeding for a few days every few months since. Last spotting was last wk; had mild dysmen with bleeding, improved with ibup. Also with VS sx. Hx of menorrhagia/leio, controlled with depo years ago.    Patient Active Problem List   Diagnosis Date Noted   AAA (abdominal aortic aneurysm) (HCC) 07/06/2023   Premature menopause 05/05/2022   BV (bacterial vaginosis) 05/05/2022   Amenorrhea 01/24/2020   Family history of ovarian cancer 01/24/2020   External hemorrhoid 01/24/2020   Hidradenitis 05/31/2018   Sepsis (HCC) 05/09/2018    Past Surgical History:  Procedure Laterality Date   CHOLECYSTECTOMY     TUBAL LIGATION     WOUND DEBRIDEMENT Bilateral 05/27/2020   Procedure: DEBRIDEMENT WOUND;  Surgeon: Earline Mayotte, MD;  Location: [facility] ORS;  Service: General;  Laterality: Bilateral;  debridement hidradenitis right axilla and left chest wall    Family History  Problem Relation Age of Onset   Hypertension Mother    Stroke Mother    Heart disease Father    Hypertension Father    Ovarian cancer Sister 21       1/2 sister on pat side   Breast cancer Maternal Aunt 51    Social History   Socioeconomic History   Marital status: Single    Spouse name: Not on file    Number of children: Not on file   Years of education: Not on file   Highest education level: Not on file  Occupational History   Not on file  Tobacco Use   Smoking status: Former    Types: Cigarettes   Smokeless tobacco: Never   Tobacco comments:    Smoked for 2 years.  Vaping Use   Vaping status: Never Used  Substance and Sexual Activity   Alcohol use: Not Currently    Comment: socially   Drug use: Never   Sexual activity: Yes    Partners: Male    Birth control/protection: Surgical    Comment: Tubal Ligation  Other Topics Concern   Not on file  Social History Narrative   Not on file   Social Determinants of Health   Financial Resource Strain: Low Risk  (05/07/2023)   Received from [facility]   Overall Financial Resource Strain (CARDIA)    Difficulty of Paying Living Expenses: Not hard at all  Food Insecurity: No Food Insecurity (05/07/2023)   Received from [facility]   Hunger Vital Sign    Worried About Running Out of Food in the Last Year: Never true    Ran Out of Food in the Last Year: Never true  Transportation Needs: No Transportation Needs (05/07/2023)   Received from [facility]   [facility] - Transportation  In the past 12 months, has lack of transportation kept you from medical appointments or from getting medications?: No    Lack of Transportation (Non-Medical): No  Physical Activity: Not on file  Stress: Not on file  Social Connections: Not on file  Intimate Partner Violence: Not on file    Outpatient Medications Prior to Visit  Medication Sig Dispense Refill   albuterol (PROVENTIL HFA;VENTOLIN HFA) 108 (90 Base) MCG/ACT inhaler Inhale 2 puffs into the lungs every 6 (six) hours as needed for wheezing or shortness of breath. 1 Inhaler 0   cholecalciferol (VITAMIN D3) 25 MCG (1000 UNIT) tablet Take 1,000 Units by mouth daily.     clindamycin (CLEOCIN) 300 MG capsule Take 300 mg by mouth every 6 (six) hours.      ibuprofen (ADVIL) 800 MG tablet Take 800 mg by mouth every 6 (six) hours as needed.     magnesium oxide (MAG-OX) 400 (240 Mg) MG tablet Take 400 mg by mouth daily.     metoprolol tartrate (LOPRESSOR) 50 MG tablet Take 1 tablet (50 mg total) by mouth once for 1 dose. TWO HOURS PRIOR TO CARDIAC CT 1 tablet 0   pantoprazole (PROTONIX) 40 MG tablet Take 40 mg by mouth daily.     No facility-administered medications prior to visit.    ROS:  Review of Systems BREAST: No symptoms   OBJECTIVE:   Vitals:  LMP 12/10/2021 (Approximate)   Physical Exam  Results: No results found for this or any previous visit (from the past 24 hour(s)).   Endometrial Biopsy After discussion with the patient regarding her abnormal uterine bleeding I recommended that she proceed with an endometrial biopsy for further diagnosis. The risks, benefits, alternatives, and indications for an endometrial biopsy were discussed with the patient in detail. She understood the risks including infection, bleeding, cervical laceration and uterine perforation.  Verbal consent was obtained.   PROCEDURE NOTE:  Pipelle endometrial biopsy was performed using aseptic technique with iodine preparation.  The uterus was sounded to a length of *** cm.  Adequate sampling {Actions; was/was not:31712} obtained with minimal blood loss.  The patient tolerated the procedure well.  Disposition will be pending pathology.  Assessment/Plan: No diagnosis found.    No orders of the defined types were placed in this encounter.     No follow-ups on file.  Sylis Ketchum B. Latika Kronick, PA-C 08/18/2023 4:57 PM

## 2023-08-19 ENCOUNTER — Ambulatory Visit: Payer: Medicaid Other | Admitting: Obstetrics and Gynecology

## 2023-08-19 ENCOUNTER — Encounter: Payer: Self-pay | Admitting: Obstetrics and Gynecology

## 2023-08-19 ENCOUNTER — Other Ambulatory Visit (HOSPITAL_COMMUNITY)
Admission: RE | Admit: 2023-08-19 | Discharge: 2023-08-19 | Disposition: A | Discharge status: Home / Self Care | Payer: Medicaid Other | Source: Ambulatory Visit | Attending: Obstetrics and Gynecology | Admitting: Obstetrics and Gynecology

## 2023-08-19 ENCOUNTER — Ambulatory Visit: Payer: Medicaid Other | Attending: Nurse Practitioner | Admitting: Nurse Practitioner

## 2023-08-19 ENCOUNTER — Encounter: Payer: Self-pay | Admitting: Nurse Practitioner

## 2023-08-19 VITALS — BP 125/76 | HR 80 | Ht 65.0 in | Wt 215.0 lb

## 2023-08-19 VITALS — BP 142/92 | HR 82 | Ht 65.0 in | Wt 214.2 lb

## 2023-08-19 DIAGNOSIS — N950 Postmenopausal bleeding: Secondary | ICD-10-CM | POA: Diagnosis present

## 2023-08-19 DIAGNOSIS — N939 Abnormal uterine and vaginal bleeding, unspecified: Secondary | ICD-10-CM

## 2023-08-19 DIAGNOSIS — R072 Precordial pain: Secondary | ICD-10-CM

## 2023-08-19 DIAGNOSIS — N858 Other specified noninflammatory disorders of uterus: Secondary | ICD-10-CM | POA: Diagnosis not present

## 2023-08-19 DIAGNOSIS — R030 Elevated blood-pressure reading, without diagnosis of hypertension: Secondary | ICD-10-CM

## 2023-08-19 NOTE — Progress Notes (Signed)
Office Visit    Patient Name: Meredith Harris Date of Encounter: 08/19/2023  Primary Care Provider:  Enid Baas, MD Primary Cardiologist:  Debbe Odea, MD  Chief Complaint    47 y.o. female with a history of GERD, IBS, and chest pain who presents for follow-up following recent coronary CT angiogram.  Past Medical History  Subjective   Past Medical History:  Diagnosis Date   Asthma    BRCA negative 01/2020   MyRisk neg; IBIS=10.8%   Chest pain    a. 06/2023 Cor CTA: Ca2+ = o.  Nl cors.  No significant noncardiac findings.   External hemorrhoid    Family history of breast cancer    Family history of ovarian cancer    GERD (gastroesophageal reflux disease)    IBS (irritable bowel syndrome)    Lyme disease    Past Surgical History:  Procedure Laterality Date   CHOLECYSTECTOMY     TUBAL LIGATION     WOUND DEBRIDEMENT Bilateral 05/27/2020   Procedure: DEBRIDEMENT WOUND;  Surgeon: Earline Mayotte, MD;  Location: [facility] ORS;  Service: General;  Laterality: Bilateral;  debridement hidradenitis right axilla and left chest wall    Allergies  Allergies  Allergen Reactions   Penicillin G Anaphylaxis   Penicillins Hives, Itching, Swelling and Anaphylaxis    Has patient had a PCN reaction causing immediate rash, facial/tongue/throat swelling, SOB or lightheadedness with hypotension: Yes  Has patient had a PCN reaction causing severe rash involving mucus membranes or skin necrosis: Unknown  Has patient had a PCN reaction that required hospitalization: Unknown  Has patient had a PCN reaction occurring within the last 10 years: Unknown  If all of the above answers are "NO", then may proceed with Cephalosporin use.  Has patient had a PCN reaction causing immediate rash, facial/tongue/throat swelling, SOB or lightheadedness with hypotension: Yes  Has patient had a PCN reaction causing severe rash involving mucus membranes or skin necrosis: Unknown  Has patient  had a PCN reaction that required hospitalization: Unknown  Has patient had a PCN reaction occurring within the last 10 years: Unknown  If all of the above answers are "NO", then may proceed with Cephalosporin use.   Sulfa Antibiotics Anaphylaxis and Swelling   Morphine And Codeine Other (See Comments)    Restless legs      History of Present Illness      47 y.o. y/o female with a history of GERD, IBS, and chest pain.  Prior echo at [facility] in March 2023 showed normal LV function.  She established with Dr. Azucena Cecil in October following ED visit for chest pain at which time, troponins were normal.  She subsequently underwent coronary CT angiogram which showed a calcium score of 0, normal coronary arteries, and no significant noncardiac findings.    She has generally felt well since her last visit, but she is currently experiencing some right shoulder pain, which she thinks is driving up her blood pressure today.  She is continue to have intermittent mild pressure in her left shoulder and left upper chest, occurring randomly, lasting 5 to 10 minutes, resolving spontaneously.  This the same symptoms that prompted coronary CTA previously.  No dyspnea, and denies palpitations, PND, orthopnea, dizziness, syncope, edema, or early satiety.  She has questions about abdominal ultrasound that she received in October, which incidentally showed a suprarenal proximal abdominal aorta measuring 3 cm with recommendation for 3-year follow-up.  All questions answered. Objective  Home Medications    Current  Outpatient Medications  Medication Sig Dispense Refill   albuterol (PROVENTIL HFA;VENTOLIN HFA) 108 (90 Base) MCG/ACT inhaler Inhale 2 puffs into the lungs every 6 (six) hours as needed for wheezing or shortness of breath. 1 Inhaler 0   cholecalciferol (VITAMIN D3) 25 MCG (1000 UNIT) tablet Take 1,000 Units by mouth daily.     clindamycin (CLEOCIN) 300 MG capsule Take 300 mg by mouth every 6 (six) hours.      ibuprofen (ADVIL) 800 MG tablet Take 800 mg by mouth every 6 (six) hours as needed.     magnesium oxide (MAG-OX) 400 (240 Mg) MG tablet Take 400 mg by mouth daily.     pantoprazole (PROTONIX) 40 MG tablet Take 40 mg by mouth daily.     No current facility-administered medications for this visit.     Physical Exam    VS:  BP (!) 140/84 (BP Location: Left Arm, Patient Position: Sitting, Cuff Size: Normal)   Pulse 82   Ht 5\' 5"  (1.651 m)   Wt 214 lb 3.2 oz (97.2 kg)   LMP 12/10/2021 (Approximate)   SpO2 98%   BMI 35.64 kg/m  , BMI Body mass index is 35.64 kg/m.     Vitals:   08/19/23 1414 08/19/23 1443  BP: (!) 140/84 (!) 142/92  Pulse: 82   SpO2: 98%       GEN: Well nourished, well developed, in no acute distress. HEENT: normal. Neck: Supple, no JVD, carotid bruits, or masses. Cardiac: RRR, no murmurs, rubs, or gallops. No clubbing, cyanosis, edema.  Radials 2+/PT 2+ and equal bilaterally.  Respiratory:  Respirations regular and unlabored, clear to auscultation bilaterally. GI: Soft, nontender, nondistended, BS + x 4. MS: no deformity or atrophy. Skin: warm and dry, no rash. Neuro:  Strength and sensation are intact. Psych: Normal affect.  Accessory Clinical Findings    ECG personally reviewed by me today - EKG Interpretation Date/Time:  Thursday August 19 2023 14:20:48 EST Ventricular Rate:  82 PR Interval:  154 QRS Duration:  72 QT Interval:  386 QTC Calculation: 450 R Axis:   14  Text Interpretation: Normal sinus rhythm Nonspecific T wave abnormality Confirmed by Nicolasa Ducking [phone]) on 08/19/2023 2:24:46 PM  - no acute changes.  Lab Results  Component Value Date   WBC 6.4 04/21/2023   HGB 13.4 04/21/2023   HCT 38.5 04/21/2023   MCV 86.9 04/21/2023   PLT 280 04/21/2023   Lab Results  Component Value Date   CREATININE 1.03 (H) 07/08/2023   BUN 10 07/08/2023   NA 145 (H) 07/08/2023   K 4.5 07/08/2023   CL 106 07/08/2023   CO2 25 07/08/2023    Lab Results  Component Value Date   ALT 21 06/01/2020   AST 18 06/01/2020   ALKPHOS 74 06/01/2020   BILITOT 0.5 06/01/2020    Lab Results  Component Value Date   HGBA1C 5.8 (A) 08/13/2021   HGBA1C 5.8 08/13/2021   HGBA1C 5.8 08/13/2021   HGBA1C 5.8 08/13/2021   Lab Results  Component Value Date   TSH 0.934 04/21/2023       Assessment & Plan    1.  Precordial chest pain: Patient previously evaluated due to chest pain and pressure with subsequent coronary CTA calcium of  0 showing calcium score of 0 and normal coronary arteries.  No significant noncardiac findings noted.  She is continue to have intermittent left shoulder and upper chest discomfort, occurring randomly without associated symptoms, lasting 5 to 10 minutes,  resolving spontaneously.  Reassurance provided based on CTA findings.  Encouraged at least 150 minutes of exercise weekly and as close to a goal for blood based diet as she is comfortable with.  2.  Elevated blood pressure: Blood pressure elevated at 2 recordings today-peak of 148/92.  She says this is very unusual for her things previous because of pain in her right shoulder, where she previously had surgery.  And her blood pressure was 120/90 at her last office visit here.  I encouraged her to check blood pressure twice daily over the next 2 weeks and either report back to [place] or present to her primary care provider when she sees her in early December.  Patient plans to address with her primary care provider.  3.  Disposition: Follow-up as needed.  Nicolasa Ducking, NP 08/19/2023, 2:41 PM

## 2023-08-19 NOTE — Patient Instructions (Signed)
Medication Instructions:  No changes *If you need a refill on your cardiac medications before your next appointment, please call your pharmacy*   Lab Work: None ordered If you have labs (blood work) drawn today and your tests are completely normal, you will receive your results only by: MyChart Message (if you have MyChart) OR A paper copy in the mail If you have any lab test that is abnormal or we need to change your treatment, we will call you to review the results.   Testing/Procedures: None ordered   Follow-Up: At [facility], you and your health needs are our priority.  As part of our continuing mission to provide you with exceptional heart care, we have created designated Provider Care Teams.  These Care Teams include your primary Cardiologist (physician) and Advanced Practice Providers (APPs -  Physician Assistants and Nurse Practitioners) who all work together to provide you with the care you need, when you need it.  We recommend signing up for the patient portal called "MyChart".  Sign up information is provided on this After Visit Summary.  MyChart is used to connect with patients for Virtual Visits (Telemedicine).  Patients are able to view lab/test results, encounter notes, upcoming appointments, etc.  Non-urgent messages can be sent to your provider as well.   To learn more about what you can do with MyChart, go to [email or web address].    Your next appointment:   Follow up as needed  

## 2023-08-19 NOTE — Patient Instructions (Signed)
I value your feedback and you entrusting us with your care. If you get a [facility] patient survey, I would appreciate you taking the time to let us know about your experience today. Thank you! ? ? ?

## 2023-08-23 LAB — SURGICAL PATHOLOGY

## 2023-10-02 ENCOUNTER — Ambulatory Visit
Admission: EM | Admit: 2023-10-02 | Discharge: 2023-10-02 | Disposition: A | Discharge status: Home / Self Care | Payer: Medicaid Other | Attending: Emergency Medicine | Admitting: Emergency Medicine

## 2023-10-02 ENCOUNTER — Encounter: Payer: Self-pay | Admitting: *Deleted

## 2023-10-02 ENCOUNTER — Other Ambulatory Visit: Payer: Self-pay

## 2023-10-02 DIAGNOSIS — J4521 Mild intermittent asthma with (acute) exacerbation: Secondary | ICD-10-CM | POA: Diagnosis not present

## 2023-10-02 DIAGNOSIS — J069 Acute upper respiratory infection, unspecified: Secondary | ICD-10-CM

## 2023-10-02 LAB — POC COVID19/FLU A&B COMBO
Covid Antigen, POC: NEGATIVE
Influenza A Antigen, POC: NEGATIVE
Influenza B Antigen, POC: NEGATIVE

## 2023-10-02 MED ORDER — ACETAMINOPHEN 325 MG PO TABS
650.0000 mg | ORAL_TABLET | Freq: Once | ORAL | Status: AC
  Administered 2023-10-02: 650 mg via ORAL

## 2023-10-02 MED ORDER — PROMETHAZINE-DM 6.25-15 MG/5ML PO SYRP: 5.0000 mL | ORAL_SOLUTION | Freq: Every evening | ORAL | 0 refills | Status: DC | PRN

## 2023-10-02 MED ORDER — BENZONATATE 100 MG PO CAPS: 100.0000 mg | ORAL_CAPSULE | Freq: Three times a day (TID) | ORAL | 0 refills | Status: AC

## 2023-10-02 MED ORDER — PREDNISONE 10 MG (21) PO TBPK: ORAL_TABLET | Freq: Every day | ORAL | 0 refills | Status: DC

## 2023-10-02 MED ORDER — DEXAMETHASONE SODIUM PHOSPHATE 10 MG/ML IJ SOLN
10.0000 mg | Freq: Once | INTRAMUSCULAR | Status: AC
  Administered 2023-10-02: 10 mg via INTRAMUSCULAR

## 2023-10-02 NOTE — ED Triage Notes (Signed)
 States woke up yesterday with wheezing and cough. Reports productive yellow sputum, cough, dyspnea today. Has been using her inhaler. Denies fevers.

## 2023-10-02 NOTE — Discharge Instructions (Signed)
 Your symptoms today are most likely being caused by a virus and should steadily improve in time it can take up to 7 to 10 days before you truly start to see a turnaround however things will get better    COVID and flu testing negative  Been given an injection of a steroid to help and relax the airway and ideally will see improvement within the hour  Begin prednisone  every morning with food to open and relax the airway, start medicine tomorrow  You may use Tessalon  pill every 8 hours to help calm your cough you may use cough syrup at bedtime You can take Tylenol  and/or Ibuprofen  as needed for fever reduction and pain relief.   For cough: honey 1/2 to 1 teaspoon (you can dilute the honey in water or another fluid).  You can also use guaifenesin and dextromethorphan for cough. You can use a humidifier for chest congestion and cough.  If you don't have a humidifier, you can sit in the bathroom with the hot shower running.      For sore throat: try warm salt water gargles, cepacol lozenges, throat spray, warm tea or water with lemon/honey, popsicles or ice, or OTC cold relief medicine for throat discomfort.   For congestion: take a daily anti-histamine like Zyrtec, Claritin, and a oral decongestant, such as pseudoephedrine.  You can also use Flonase  1-2 sprays in each nostril daily.   It is important to stay hydrated: drink plenty of fluids (water, gatorade/powerade/pedialyte, juices, or teas) to keep your throat moisturized and help further relieve irritation/discomfort.

## 2023-10-02 NOTE — ED Provider Notes (Signed)
 [name]    CSN: [id number] Arrival date & time: 10/02/23  1311      History   Chief Complaint Chief Complaint  Patient presents with   Chest Pain   Wheezing    HPI Meredith Harris is a 48 y.o. female.   Patient presents for evaluation of a productive cough, shortness of breath at rest, wheezing, sore throat, intermittent generalized headaches and sinus pressure across the forehead and nose bridge beginning 1 day ago.  Known sick contacts.  Decreased appetite but able to tolerate some food and liquids.  Has attempted use of Robitussin.  Denies presence of fever.  History of asthma.  Past Medical History:  Diagnosis Date   Asthma    BRCA negative 01/2020   MyRisk neg; IBIS=10.8%   Chest pain    a. 06/2023 Cor CTA: Ca2+ = o.  Nl cors.  No significant noncardiac findings.   External hemorrhoid    Family history of breast cancer    Family history of ovarian cancer    GERD (gastroesophageal reflux disease)    IBS (irritable bowel syndrome)    Lyme disease     Patient Active Problem List   Diagnosis Date Noted   AAA (abdominal aortic aneurysm) (HCC) 07/06/2023   Premature menopause 05/05/2022   BV (bacterial vaginosis) 05/05/2022   Amenorrhea 01/24/2020   Family history of ovarian cancer 01/24/2020   External hemorrhoid 01/24/2020   Hidradenitis 05/31/2018   Sepsis (HCC) 05/09/2018    Past Surgical History:  Procedure Laterality Date   CHOLECYSTECTOMY     TUBAL LIGATION     WOUND DEBRIDEMENT Bilateral 05/27/2020   Procedure: DEBRIDEMENT WOUND;  Surgeon: Dessa Reyes ORN, MD;  Location: [facility] ORS;  Service: General;  Laterality: Bilateral;  debridement hidradenitis right axilla and left chest wall    OB History     Gravida  4   Para  4   Term  4   Preterm      AB      Living  4      SAB      IAB      Ectopic      Multiple      Live Births  4            Home Medications    Prior to Admission medications   Medication Sig  Start Date End Date Taking? Authorizing Provider  albuterol  (PROVENTIL  HFA;VENTOLIN  HFA) 108 (90 Base) MCG/ACT inhaler Inhale 2 puffs into the lungs every 6 (six) hours as needed for wheezing or shortness of breath. 12/08/18  Yes Floy Roberts, MD  benzonatate  (TESSALON ) 100 MG capsule Take 1 capsule (100 mg total) by mouth every 8 (eight) hours. 10/02/23  Yes Arjen Deringer R, NP  cholecalciferol (VITAMIN D3) 25 MCG (1000 UNIT) tablet Take 1,000 Units by mouth daily.   Yes [provider]  magnesium oxide (MAG-OX) 400 (240 Mg) MG tablet Take 400 mg by mouth daily.   Yes [provider]  pantoprazole  (PROTONIX ) 20 MG tablet Take by mouth. 08/02/23  Yes [provider]  predniSONE  (STERAPRED UNI-PAK 21 TAB) 10 MG (21) TBPK tablet Take by mouth daily. Take 6 tabs by mouth daily  for 1 days, then 5 tabs for 1 days, then 4 tabs for 1 days, then 3 tabs for 1 days, 2 tabs for 1 days, then 1 tab by mouth daily for 1 days 10/02/23  Yes Hallel Denherder R, NP  promethazine -dextromethorphan (PROMETHAZINE -DM) 6.25-15 MG/5ML  syrup Take 5 mLs by mouth at bedtime as needed. 10/02/23  Yes Heide Brossart R, NP  clindamycin  (CLEOCIN ) 300 MG capsule Take 300 mg by mouth every 6 (six) hours. 07/13/23   [provider]  ibuprofen  (ADVIL ) 800 MG tablet Take 800 mg by mouth every 6 (six) hours as needed. 07/13/23   [provider]    Family History Family History  Problem Relation Age of Onset   Hypertension Mother    Stroke Mother    Heart disease Father    Hypertension Father    Ovarian cancer Sister 51       1/2 sister on pat side   Breast cancer Maternal Aunt 47    Social History Social History   Tobacco Use   Smoking status: Former    Types: Cigarettes   Smokeless tobacco: Never   Tobacco comments:    Smoked for 2 years.  Vaping Use   Vaping status: Never Used  Substance Use Topics   Alcohol use: Not Currently   Drug use: Never     Allergies    Penicillin g, Penicillins, Sulfa antibiotics, and Morphine and codeine   Review of Systems Review of Systems   Physical Exam Triage Vital Signs ED Triage Vitals [10/02/23 1403]  Encounter Vitals Group     BP (!) 146/90     Systolic BP Percentile      Diastolic BP Percentile      Pulse Rate 98     Resp 20     Temp (!) 101.7 F (38.7 C)     Temp Source Oral     SpO2 99 %     Weight      Height      Head Circumference      Peak Flow      Pain Score 10     Pain Loc      Pain Education      Exclude from Growth Chart    No data found.  Updated Vital Signs BP (!) 146/90   Pulse 98   Temp (!) 101.7 F (38.7 C) (Oral)   Resp 20   LMP 12/10/2021 (Approximate)   SpO2 99%   Visual Acuity Right Eye Distance:   Left Eye Distance:   Bilateral Distance:    Right Eye Near:   Left Eye Near:    Bilateral Near:     Physical Exam Constitutional:      Appearance: Normal appearance.  HENT:     Head: Normocephalic.     Right Ear: Tympanic membrane, ear canal and external ear normal.     Left Ear: Tympanic membrane, ear canal and external ear normal.     Nose: Congestion present. No rhinorrhea.     Mouth/Throat:     Mouth: Mucous membranes are moist.     Pharynx: Oropharynx is clear. No oropharyngeal exudate or posterior oropharyngeal erythema.  Eyes:     Extraocular Movements: Extraocular movements intact.  Cardiovascular:     Rate and Rhythm: Normal rate and regular rhythm.     Pulses: Normal pulses.     Heart sounds: Normal heart sounds.  Pulmonary:     Effort: Pulmonary effort is normal.     Breath sounds: Wheezing present.  Neurological:     Mental Status: She is alert and oriented to person, place, and time. Mental status is at baseline.      UC Treatments / Results  Labs (all labs ordered are listed, but only abnormal results are  displayed) Labs Reviewed  POC COVID19/FLU A&B COMBO    EKG   Radiology No results found.  Procedures Procedures  (including critical care time)  Medications Ordered in UC Medications  acetaminophen  (TYLENOL ) tablet 650 mg (650 mg Oral Given 10/02/23 1408)  dexamethasone  (DECADRON ) injection 10 mg (10 mg Intramuscular Given 10/02/23 1437)    Initial Impression / Assessment and Plan / UC Course  I have reviewed the triage vital signs and the nursing notes.  Pertinent labs & imaging results that were available during my care of the patient were reviewed by me and considered in my medical decision making (see chart for details).  Viral URI with cough, mild intermittent asthma with acute exacerbation  Patient is in no signs of distress nor toxic appearing.  Fever of  101.7 noted in triage, given Tylenol .  Low suspicion for pneumonia, pneumothorax or bronchitis and therefore will defer imaging.  COVID and flu testing negative etiology most likely viral flaring asthma, discussed with patient.  Prescribed prednisone , Tessalon  and Promethazine  DM, Decadron  IM given prior to discharge.May use additional over-the-counter medications as needed for supportive care.  May follow-up with urgent care as needed if symptoms persist or worsen.    Final Clinical Impressions(s) / UC Diagnoses   Final diagnoses:  Mild intermittent asthma with acute exacerbation  Viral URI with cough     Discharge Instructions      Your symptoms today are most likely being caused by a virus and should steadily improve in time it can take up to 7 to 10 days before you truly start to see a turnaround however things will get better    COVID and flu testing negative  Been given an injection of a steroid to help and relax the airway and ideally will see improvement within the hour  Begin prednisone  every morning with food to open and relax the airway, start medicine tomorrow  You may use Tessalon  pill every 8 hours to help calm your cough you may use cough syrup at bedtime You can take Tylenol  and/or Ibuprofen  as needed for fever reduction  and pain relief.   For cough: honey 1/2 to 1 teaspoon (you can dilute the honey in water or another fluid).  You can also use guaifenesin and dextromethorphan for cough. You can use a humidifier for chest congestion and cough.  If you don't have a humidifier, you can sit in the bathroom with the hot shower running.      For sore throat: try warm salt water gargles, cepacol lozenges, throat spray, warm tea or water with lemon/honey, popsicles or ice, or OTC cold relief medicine for throat discomfort.   For congestion: take a daily anti-histamine like Zyrtec, Claritin, and a oral decongestant, such as pseudoephedrine.  You can also use Flonase  1-2 sprays in each nostril daily.   It is important to stay hydrated: drink plenty of fluids (water, gatorade/powerade/pedialyte, juices, or teas) to keep your throat moisturized and help further relieve irritation/discomfort.    ED Prescriptions     Medication Sig Dispense Auth. Provider   predniSONE  (STERAPRED UNI-PAK 21 TAB) 10 MG (21) TBPK tablet Take by mouth daily. Take 6 tabs by mouth daily  for 1 days, then 5 tabs for 1 days, then 4 tabs for 1 days, then 3 tabs for 1 days, 2 tabs for 1 days, then 1 tab by mouth daily for 1 days 21 tablet Leatrice Parilla R, NP   benzonatate  (TESSALON ) 100 MG capsule Take 1 capsule (100 mg  total) by mouth every 8 (eight) hours. 21 capsule Kendria Halberg R, NP   promethazine -dextromethorphan (PROMETHAZINE -DM) 6.25-15 MG/5ML syrup Take 5 mLs by mouth at bedtime as needed. 118 mL Denica Web, Shelba SAUNDERS, NP      PDMP not reviewed this encounter.   Teresa Shelba SAUNDERS, NP 10/02/23 1500

## 2023-10-11 ENCOUNTER — Telehealth: Payer: Self-pay | Admitting: Emergency Medicine

## 2023-10-11 ENCOUNTER — Ambulatory Visit
Admission: EM | Admit: 2023-10-11 | Discharge: 2023-10-11 | Discharge status: Against Medical Advice | Payer: Medicaid Other

## 2023-10-11 MED ORDER — DOXYCYCLINE HYCLATE 100 MG PO CAPS: 100.0000 mg | ORAL_CAPSULE | Freq: Two times a day (BID) | ORAL | 0 refills | Status: AC

## 2023-10-11 MED ORDER — PREDNISONE 10 MG (21) PO TBPK: ORAL_TABLET | Freq: Every day | ORAL | 0 refills | Status: DC

## 2023-10-11 NOTE — Telephone Encounter (Signed)
 Initially evaluated by this provider, patient presents to clinic endorsing persisting but not worsening symptoms, did not complete steroid course as she was missing some dosages of medication, denies shortness of breath and wheezing at this time, prescribed doxycycline  and prednisone  taper advised in person reevaluation if symptoms do not resolve

## 2023-10-11 NOTE — ED Triage Notes (Signed)
 [facility]BS

## 2024-02-15 ENCOUNTER — Other Ambulatory Visit: Payer: Self-pay

## 2024-02-15 ENCOUNTER — Emergency Department

## 2024-02-15 ENCOUNTER — Emergency Department
Admission: EM | Admit: 2024-02-15 | Discharge: 2024-02-15 | Disposition: A | Discharge status: Home / Self Care | Attending: Emergency Medicine | Admitting: Emergency Medicine

## 2024-02-15 DIAGNOSIS — R079 Chest pain, unspecified: Secondary | ICD-10-CM

## 2024-02-15 DIAGNOSIS — R0789 Other chest pain: Secondary | ICD-10-CM | POA: Insufficient documentation

## 2024-02-15 DIAGNOSIS — R0602 Shortness of breath: Secondary | ICD-10-CM | POA: Diagnosis not present

## 2024-02-15 LAB — CBC
HCT: 38 % (ref 36.0–46.0)
Hemoglobin: 12.7 g/dL (ref 12.0–15.0)
MCH: 29 pg (ref 26.0–34.0)
MCHC: 33.4 g/dL (ref 30.0–36.0)
MCV: 86.8 fL (ref 80.0–100.0)
Platelets: 294 10*3/uL (ref 150–400)
RBC: 4.38 MIL/uL (ref 3.87–5.11)
RDW: 12.9 % (ref 11.5–15.5)
WBC: 5.8 10*3/uL (ref 4.0–10.5)
nRBC: 0 % (ref 0.0–0.2)

## 2024-02-15 LAB — TROPONIN I (HIGH SENSITIVITY)
Troponin I (High Sensitivity): 6 ng/L (ref <18)
Troponin I (High Sensitivity): 5 ng/L (ref <18)

## 2024-02-15 LAB — BASIC METABOLIC PANEL WITH GFR
Anion gap: 13 (ref 5–15)
BUN: 12 mg/dL (ref 6–20)
CO2: 25 mmol/L (ref 22–32)
Calcium: 9.5 mg/dL (ref 8.9–10.3)
Chloride: 103 mmol/L (ref 98–111)
Creatinine, Ser: 0.99 mg/dL (ref 0.44–1.00)
GFR, Estimated: >60 mL/min (ref >60)
Glucose, Bld: 107 mg/dL — ABNORMAL HIGH (ref 70–99)
Potassium: 3.4 mmol/L — ABNORMAL LOW (ref 3.5–5.1)
Sodium: 141 mmol/L (ref 135–145)

## 2024-02-15 MED ORDER — ACETAMINOPHEN 500 MG PO TABS
1000.0000 mg | ORAL_TABLET | Freq: Once | ORAL | Status: AC
  Administered 2024-02-15: 1000 mg via ORAL
  Filled 2024-02-15: qty 2

## 2024-02-15 MED ORDER — KETOROLAC TROMETHAMINE 30 MG/ML IJ SOLN
30.0000 mg | Freq: Once | INTRAMUSCULAR | Status: AC
  Administered 2024-02-15: 30 mg via INTRAMUSCULAR
  Filled 2024-02-15: qty 1

## 2024-02-15 NOTE — ED Provider Notes (Addendum)
 [facility] Provider Note    Event Date/Time   First MD Initiated Contact with Patient 02/15/24 1515     (approximate)   History   Chest Pain and Shortness of Breath   HPI  Meredith Harris is a 48 y.o. female with history of GERD, IBS who comes in with concerns for chest pain.  Reviewed the cardiology note from 08/19/2023.  She had coronary CTA previously secondary to some right shoulder pain and elevated blood pressure.  She had a calcium of 0 which was consistent with normal coronary arteries.  Patient reports that she was at work.  She was walking around when she experienced some chest pressure that is non radiating and some shortness of breath.  She tried taking her inhaler and it did not work and went to an urgent care.  At the urgent care they noticed that her EKG had some T wave inversions so sent her here for further evaluation.  She did get a DuoNeb, aspirin .  She reports that her pain is significantly improved.  She denies any shortness of breath maybe some minimal chest pressure. Pt has no risk factors for PE.  She denies any type of oral contraception, or other forms of birth control.  Patient does ask if this could be related to stress, anxiety.  She reports that prior to the event happening that she did experience a stressful event.      Physical Exam   Triage Vital Signs: ED Triage Vitals  Encounter Vitals Group     BP 02/15/24 1247 (!) 171/95     Systolic BP Percentile --      Diastolic BP Percentile --      Pulse Rate 02/15/24 1247 61     Resp 02/15/24 1247 16     Temp 02/15/24 1247 97.9 F (36.6 C)     Temp Source 02/15/24 1247 Oral     SpO2 02/15/24 1247 100 %     Weight 02/15/24 1248 214 lb (97.1 kg)     Height 02/15/24 1248 5\' 5"  (1.651 m)     Head Circumference --      Peak Flow --      Pain Score 02/15/24 1247 6     Pain Loc --      Pain Education --      Exclude from Growth Chart --     Most recent vital  signs: Vitals:   02/15/24 1247  BP: (!) 171/95  Pulse: 61  Resp: 16  Temp: 97.9 F (36.6 C)  SpO2: 100%     General: Awake, no distress.  CV:  Good peripheral perfusion.  No murmur Resp:  Normal effort.  Clear lungs Abd:  No distention.  Other:  No swelling in legs.  No calf tenderness equal radial pulses equal DP pulses No rash noted no chest wall tenderness Cranial nerves appear intact equal strength in arms and legs.  ED Results / Procedures / Treatments   Labs (all labs ordered are listed, but only abnormal results are displayed) Labs Reviewed  BASIC METABOLIC PANEL WITH GFR - Abnormal; Notable for the following components:      Result Value   Potassium 3.4 (*)    Glucose, Bld 107 (*)    All other components within normal limits  CBC  POC URINE PREG, ED  TROPONIN I (HIGH SENSITIVITY)  TROPONIN I (HIGH SENSITIVITY)     EKG  My interpretation of EKG:  Normal sinus rate of 80  without any ST elevation, T wave inversions in lead III, aVF, V3 through V6, normal intervals  Reviewed prior EKG from November 2024 where patient had similar T wave inversions  RADIOLOGY I have reviewed the xray personally and interpreted no evidence of any pneumonia, pneumothorax   PROCEDURES:  Critical Care performed: No  .1-3 Lead EKG Interpretation  Performed by: Lubertha Rush, MD Authorized by: Lubertha Rush, MD     Interpretation: abnormal     ECG rate:  55   ECG rate assessment: bradycardic     Rhythm: sinus bradycardia     Ectopy: none     Conduction: normal   Comments:     Sinus but occasional  sinus bradycardia     MEDICATIONS ORDERED IN ED: Medications  acetaminophen  (TYLENOL ) tablet 1,000 mg (has no administration in time range)  ketorolac  (TORADOL ) 30 MG/ML injection 30 mg (has no administration in time range)     IMPRESSION / MDM / ASSESSMENT AND PLAN / ED COURSE  I reviewed the triage vital signs and the nursing notes.   Patient's presentation is  most consistent with acute presentation with potential threat to life or bodily function.      INITIAL IMPRESSION / ASSESSMENT AND PLAN / ED COURSE   Most Likely DDx:  -Consider ACS vs MSK Vs Tonna Frederic- will get EKG/troponin to evaluate for ACS EKG with T wave inversions to definitely requires troponins x 2 given onset of pain but it actually does look pretty similar to her prior EKGs.  Patient is hypertensive but she states that at home her blood pressures typically run in the 130 systolic.  She attributes this more to just the stress of being here, discomfort.   Pt declined pregnancy test- has had tubal ligation  DDx that was also considered d/t potential to cause harm, but was found less likely based on history and physical (as detailed above): -PNA (no fevers, cough but CXR to evaluate) -PNX (reassured with equal b/l breath sounds, CXR to evaluate) -Symptomatic anemia (will get H&H) -Pulmonary embolism as no sob at rest, not pleuritic in nature, no hypoxia PERC negative  -Aortic Dissection as no tearing pain and no radiation to the mid back, pulses equal, no murmur -Pericarditis no EKG changes or hx to suggest dx -Tamponade (no notable SOB, tachycardic, hypotensive) -Esophageal rupture (no h/o diffuse vomitting/no crepitus)   Trop negative x2, no anemia, no white count to suggest infection - on repeat eval she reports feeling better but does have a mild headache but she states these are typical for her, for like a sinus headache.  She denies being the worst headache of her life.  Her neuroexam is intact.  We discussed CT imaging but of opted to decline given this is similar to her prior headaches.   At this time given re-assuring workup I have considered CT imaging to rule out PE/Dissection but given history and physical exam these seem less likely and potential harm from CT would outweigh the probability of finding PE or Dissection.    I have considered admission for patient, but  given re-assuring workup including EKG, troponin, and re-assuring vitals patient can follow up outpatient with cardiology.   Discussed with patient that I can not predict future heart attacks and that cardiology can evaluate for need for further workup including stress test.  Explained to patient that if there is a change in symptoms, worsening symptoms, or any other concerns that they should return for repeat evaluation to have  repeat EKG/troponin. They expressed understanding.  Upon discharging patient she did another brief episode of some chest pain on the left chest wall that felt different.  I will order repeat EKG 4:40 PM repeat EKG is sinus rate of 60 without any ST elevation similar T wave inversions in 3 aVF V3 through V6.   When I went to reevaluate patient she reported the pain was already gone.  Her EKG is stable from prior.  We discussed her staying longer for repeat troponins as it would take 3 hours to know if this was anything more serious however patient declined she would prefer to go home and follow-up outpatient with cardiology.  Given patient's low heart score and reassuring EKG, troponins my suspicion at this time is that this is unlikely ACS however patient understands that if the pain comes back that she can return for repeat troponins at that time.  She would prefer to do that at this time  Pt is requesting 2 days off for work given she reports a lot of stress at work that she thinks caused this.    ____________________________________________   Note:  This document was prepared using Conservation officer, historic buildings and may include unintentional dictation errors.   The patient is on the cardiac monitor to evaluate for evidence of arrhythmia and/or significant heart rate changes.      FINAL CLINICAL IMPRESSION(S) / ED DIAGNOSES   Final diagnoses:  Chest pain, unspecified type     Rx / DC Orders   ED Discharge Orders     None        Note:  This document  was prepared using Dragon voice recognition software and may include unintentional dictation errors.   Lubertha Rush, MD 02/15/24 1625    Lubertha Rush, MD 02/15/24 1626    Lubertha Rush, MD 02/15/24 [phone]

## 2024-02-15 NOTE — ED Triage Notes (Signed)
 Pt states approx an hour ago while working pt started to have chest pressure and sob, states that she used her inhaler without relief, pt went to the [facility] urgent care and was given a breathing treatment and states it didn't change the pressure feeling in her chest, pt was given an EKG and told that it was abnormal

## 2024-02-15 NOTE — ED Notes (Signed)
 Pt is CAOx4, breathing normally, and normal in color. Pt is lying in bed and does not appear to be in any distress. Pt reports chest pressure that is not getting any better. Pt encouraged to give a urine sample.

## 2024-02-15 NOTE — Discharge Instructions (Addendum)
 Your workup today was negative for heart attack however we cannot predict future heart attacks that should follow up with your up with your cardiologist return to the ER if develop worsening symptoms or any other concerns

## 2024-03-06 ENCOUNTER — Other Ambulatory Visit: Payer: Self-pay

## 2024-03-06 ENCOUNTER — Ambulatory Visit (LOCAL_COMMUNITY_HEALTH_CENTER): Payer: Self-pay

## 2024-03-06 DIAGNOSIS — Z111 Encounter for screening for respiratory tuberculosis: Secondary | ICD-10-CM

## 2024-03-08 ENCOUNTER — Ambulatory Visit (LOCAL_COMMUNITY_HEALTH_CENTER)

## 2024-03-08 DIAGNOSIS — Z111 Encounter for screening for respiratory tuberculosis: Secondary | ICD-10-CM

## 2024-03-08 LAB — TB SKIN TEST
Induration: 0 mm
TB Skin Test: NEGATIVE

## 2024-05-05 ENCOUNTER — Encounter: Payer: Self-pay | Admitting: Rheumatology

## 2024-05-05 ENCOUNTER — Other Ambulatory Visit: Payer: Self-pay | Admitting: Rheumatology

## 2024-05-05 DIAGNOSIS — M2550 Pain in unspecified joint: Secondary | ICD-10-CM

## 2024-05-10 ENCOUNTER — Ambulatory Visit
Admission: RE | Admit: 2024-05-10 | Discharge: 2024-05-10 | Disposition: A | Discharge status: Home / Self Care | Source: Ambulatory Visit | Attending: Rheumatology | Admitting: Rheumatology

## 2024-05-10 DIAGNOSIS — M2550 Pain in unspecified joint: Secondary | ICD-10-CM

## 2024-05-26 ENCOUNTER — Encounter: Payer: Self-pay | Admitting: Emergency Medicine

## 2024-05-26 ENCOUNTER — Other Ambulatory Visit: Payer: Self-pay | Admitting: Internal Medicine

## 2024-05-26 ENCOUNTER — Ambulatory Visit
Admission: EM | Admit: 2024-05-26 | Discharge: 2024-05-26 | Disposition: A | Discharge status: Home / Self Care | Attending: Emergency Medicine | Admitting: Emergency Medicine

## 2024-05-26 DIAGNOSIS — B349 Viral infection, unspecified: Secondary | ICD-10-CM

## 2024-05-26 DIAGNOSIS — Z1231 Encounter for screening mammogram for malignant neoplasm of breast: Secondary | ICD-10-CM

## 2024-05-26 DIAGNOSIS — J4531 Mild persistent asthma with (acute) exacerbation: Secondary | ICD-10-CM

## 2024-05-26 LAB — POC SOFIA SARS ANTIGEN FIA: SARS Coronavirus 2 Ag: NEGATIVE

## 2024-05-26 MED ORDER — METHYLPREDNISOLONE ACETATE 80 MG/ML IJ SUSP
60.0000 mg | Freq: Once | INTRAMUSCULAR | Status: AC
  Administered 2024-05-26: 60 mg via INTRAMUSCULAR

## 2024-05-26 MED ORDER — PREDNISONE 10 MG (21) PO TBPK: ORAL_TABLET | Freq: Every day | ORAL | 0 refills | Status: AC

## 2024-05-26 MED ORDER — PROMETHAZINE-DM 6.25-15 MG/5ML PO SYRP: 5.0000 mL | ORAL_SOLUTION | Freq: Four times a day (QID) | ORAL | 0 refills | Status: AC | PRN

## 2024-05-26 MED ORDER — ALBUTEROL SULFATE HFA 108 (90 BASE) MCG/ACT IN AERS: 2.0000 | INHALATION_SPRAY | Freq: Four times a day (QID) | RESPIRATORY_TRACT | 0 refills | Status: AC | PRN

## 2024-05-26 NOTE — ED Provider Notes (Signed)
 Meredith Harris    CSN: [id number] Arrival date & time: 05/26/24  1652      History   Chief Complaint Chief Complaint  Patient presents with   Chest Pain   Nasal Congestion    HPI Meredith Harris is a 48 y.o. female.   Patient presents for evaluation of nasal and chest congestion, left-sided ear pain, a mild productive cough with clear sputum, shortness of breath at rest, with intermittent wheezing, right-sided chest pain and left flank pain beginning today.  Chest pain does not radiate, described as a pressure.  History of asthma, has used inhaler.  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  Denies fever, dizziness, visual disturbance, nausea vomiting or diarrhea.  Denies personal cardiac history, familial history of high blood pressure.  Past Medical History:  Diagnosis Date   Asthma    BRCA negative 01/2020   MyRisk neg; IBIS=10.8%   Chest pain    a. 06/2023 Cor CTA: Ca2+ = o.  Nl cors.  No significant noncardiac findings.   External hemorrhoid    Family history of breast cancer    Family history of ovarian cancer    GERD (gastroesophageal reflux disease)    IBS (irritable bowel syndrome)    Lyme disease     Patient Active Problem List   Diagnosis Date Noted   AAA (abdominal aortic aneurysm) (HCC) 07/06/2023   Premature menopause 05/05/2022   BV (bacterial vaginosis) 05/05/2022   Amenorrhea 01/24/2020   Family history of ovarian cancer 01/24/2020   External hemorrhoid 01/24/2020   Hidradenitis 05/31/2018   Sepsis (HCC) 05/09/2018    Past Surgical History:  Procedure Laterality Date   CHOLECYSTECTOMY     TUBAL LIGATION     WOUND DEBRIDEMENT Bilateral 05/27/2020   Procedure: DEBRIDEMENT WOUND;  Surgeon: Dessa Reyes ORN, MD;  Location: [facility] ORS;  Service: General;  Laterality: Bilateral;  debridement hidradenitis right axilla and left chest wall    OB History     Gravida  4   Para  4   Term  4   Preterm      AB      Living  4       SAB      IAB      Ectopic      Multiple      Live Births  4            Home Medications    Prior to Admission medications   Medication Sig Start Date End Date Taking? Authorizing Provider  albuterol  (PROVENTIL  HFA;VENTOLIN  HFA) 108 (90 Base) MCG/ACT inhaler Inhale 2 puffs into the lungs every 6 (six) hours as needed for wheezing or shortness of breath. 12/08/18   Floy Roberts, MD  benzonatate  (TESSALON ) 100 MG capsule Take 1 capsule (100 mg total) by mouth every 8 (eight) hours. Patient not taking: Reported on 03/06/2024 10/02/23   Teresa Shelba JONELLE, NP  cholecalciferol (VITAMIN D3) 25 MCG (1000 UNIT) tablet Take 1,000 Units by mouth daily.    [provider]  clindamycin  (CLEOCIN ) 300 MG capsule Take 300 mg by mouth every 6 (six) hours. Patient not taking: Reported on 10/11/2023 07/13/23   [provider]  doxycycline  (VIBRAMYCIN ) 100 MG capsule Take 1 capsule (100 mg total) by mouth 2 (two) times daily. Patient not taking: Reported on 03/06/2024 10/11/23   Teresa Shelba JONELLE, NP  ibuprofen  (ADVIL ) 800 MG tablet Take 800 mg by mouth every 6 (six) hours as needed. Patient not  taking: Reported on 03/06/2024 07/13/23   [provider]  magnesium oxide (MAG-OX) 400 (240 Mg) MG tablet Take 400 mg by mouth daily.    [provider]  pantoprazole  (PROTONIX ) 20 MG tablet Take by mouth. 08/02/23   [provider]  predniSONE  (STERAPRED UNI-PAK 21 TAB) 10 MG (21) TBPK tablet Take by mouth daily. Take 6 tabs by mouth daily  for 1 days, then 5 tabs for 1 days, then 4 tabs for 1 days, then 3 tabs for 1 days, 2 tabs for 1 days, then 1 tab by mouth daily for 1 days Patient not taking: Reported on 10/11/2023 10/11/23   Teresa Shelba SAUNDERS, NP  promethazine -dextromethorphan (PROMETHAZINE -DM) 6.25-15 MG/5ML syrup Take 5 mLs by mouth at bedtime as needed. Patient not taking: Reported on 10/11/2023 10/02/23   Teresa Shelba SAUNDERS, NP    Family History Family History   Problem Relation Age of Onset   Hypertension Mother    Stroke Mother    Heart disease Father    Hypertension Father    Ovarian cancer Sister 11       1/2 sister on pat side   Breast cancer Maternal Aunt 31    Social History Social History   Tobacco Use   Smoking status: Former    Types: Cigarettes   Smokeless tobacco: Never   Tobacco comments:    Smoked for 2 years.  Vaping Use   Vaping status: Never Used  Substance Use Topics   Alcohol use: Not Currently   Drug use: Never     Allergies   Penicillin g, Penicillins, Sulfa antibiotics, and Morphine and codeine   Review of Systems Review of Systems   Physical Exam Triage Vital Signs ED Triage Vitals  Encounter Vitals Group     BP 05/26/24 1742 (!) 169/98     Girls Systolic BP Percentile --      Girls Diastolic BP Percentile --      Boys Systolic BP Percentile --      Boys Diastolic BP Percentile --      Pulse Rate 05/26/24 1742 63     Resp 05/26/24 1742 20     Temp 05/26/24 1742 98.7 F (37.1 C)     Temp Source 05/26/24 1742 Oral     SpO2 05/26/24 1742 100 %     Weight --      Height --      Head Circumference --      Peak Flow --      Pain Score 05/26/24 1738 8     Pain Loc --      Pain Education --      Exclude from Growth Chart --    No data found.  Updated Vital Signs BP (!) 169/98 (BP Location: Left Arm)   Pulse 63   Temp 98.7 F (37.1 C) (Oral)   Resp 20   SpO2 100%   Visual Acuity Right Eye Distance:   Left Eye Distance:   Bilateral Distance:    Right Eye Near:   Left Eye Near:    Bilateral Near:     Physical Exam Constitutional:      Appearance: Normal appearance.  HENT:     Head: Normocephalic.     Right Ear: Tympanic membrane, ear canal and external ear normal.     Left Ear: Tympanic membrane, ear canal and external ear normal.     Nose: Congestion present.     Mouth/Throat:     Mouth: Mucous membranes  are moist.     Pharynx: No oropharyngeal exudate or posterior  oropharyngeal erythema.  Eyes:     Extraocular Movements: Extraocular movements intact.  Cardiovascular:     Rate and Rhythm: Normal rate and regular rhythm.     Pulses: Normal pulses.     Heart sounds: Normal heart sounds.  Pulmonary:     Effort: Pulmonary effort is normal.     Breath sounds: Normal breath sounds.  Chest:     Comments: Unable to reduce tenderness of the chest wall, no ecchymosis swelling or deformity, chest wall symmetrical  Tenderness to the left flank approximately over ribs 2-3, no ecchymosis swelling or deformity Neurological:     Mental Status: She is alert and oriented to person, place, and time. Mental status is at baseline.      UC Treatments / Results  Labs (all labs ordered are listed, but only abnormal results are displayed) Labs Reviewed  POC SOFIA SARS ANTIGEN FIA    EKG   Radiology No results found.  Procedures Procedures (including critical care time)  Medications Ordered in UC Medications - No data to display  Initial Impression / Assessment and Plan / UC Course  I have reviewed the triage vital signs and the nursing notes.  Pertinent labs & imaging results that were available during my care of the patient were reviewed by me and considered in my medical decision making (see chart for details).  Viral illness, mild persistent asthma with acute exacerbation  Vital signs are stable, blood pressure slightly elevated at 169/98, lungs clear to auscultation S1-S2 heard on exam, EKG shows sinus bradycardia however on manual check shows pulse within the 60s, chest pain most likely related to asthma, discussed and advised to monitor closely, COVID testing negative, methylprednisolone  given and prescribed prednisone , albuterol  and Promethazine  DM for home use recommend over-the-counter medications and nonpharmacological supportive care, work note given and given strict ER precautions for chest pain worsening in severity Final Clinical  Impressions(s) / UC Diagnoses   Final diagnoses:  Viral illness   Discharge Instructions   None    ED Prescriptions   None    PDMP not reviewed this encounter.   Teresa Shelba SAUNDERS, [place] 05/27/24 [phone]

## 2024-05-26 NOTE — Discharge Instructions (Signed)
 Your symptoms today are most likely being caused by a virus and should steadily improve in time it can take up to 7 to 10 days before you truly start to see a turnaround however things will get better  COVID testing negative  EKG shows that her heart is beating in a regular rate and rhythm, low suspicion that your heart is involved and this is most likely related to your asthma  You have been given an injection of steroids to open and relax the airway to help with shortness of breath and wheezing I daily start see improvement within the hour  Starting tomorrow take prednisone  every morning with food as directed to continue the above process  Continue use of inhaler  You may use cough syrup every 6 hours as needed for additional comfort     You can take Tylenol  and/or Ibuprofen  as needed for fever reduction and pain relief.   For cough: honey 1/2 to 1 teaspoon (you can dilute the honey in water or another fluid).  You can also use guaifenesin and dextromethorphan for cough. You can use a humidifier for chest congestion and cough.  If you don't have a humidifier, you can sit in the bathroom with the hot shower running.      For sore throat: try warm salt water gargles, cepacol lozenges, throat spray, warm tea or water with lemon/honey, popsicles or ice, or OTC cold relief medicine for throat discomfort.   For congestion: take a daily anti-histamine like Zyrtec, Claritin, and a oral decongestant, such as pseudoephedrine.  You can also use Flonase  1-2 sprays in each nostril daily.   It is important to stay hydrated: drink plenty of fluids (water, gatorade/powerade/pedialyte, juices, or teas) to keep your throat moisturized and help further relieve irritation/discomfort.

## 2024-05-26 NOTE — ED Triage Notes (Addendum)
 Patient reports chest congestion, asthma flare up and mucus and head ache, right upper chest pain and left flank pain. Rates chest pain 8/10 and feels like pressure. Rates left flank pain. X 1 day. Patient has not taken anything for symptoms.

## 2024-06-19 ENCOUNTER — Ambulatory Visit
Admission: RE | Admit: 2024-06-19 | Discharge: 2024-06-19 | Disposition: A | Discharge status: Home / Self Care | Source: Ambulatory Visit | Attending: Internal Medicine | Admitting: Internal Medicine

## 2024-06-19 DIAGNOSIS — Z1231 Encounter for screening mammogram for malignant neoplasm of breast: Secondary | ICD-10-CM | POA: Insufficient documentation

## 2024-09-11 ENCOUNTER — Other Ambulatory Visit: Payer: Self-pay | Admitting: Family Medicine

## 2024-09-11 DIAGNOSIS — M5416 Radiculopathy, lumbar region: Secondary | ICD-10-CM

## 2024-09-16 ENCOUNTER — Inpatient Hospital Stay: Admission: RE | Admit: 2024-09-16 | Discharge: 2024-09-16 | Attending: Family Medicine | Admitting: Family Medicine

## 2024-09-16 DIAGNOSIS — M5416 Radiculopathy, lumbar region: Secondary | ICD-10-CM

## 2024-10-10 NOTE — Progress Notes (Unsigned)
 "   Gynecology Annual Exam  PCP: Sherial Bail, MD  Chief Complaint: No chief complaint on file.   History of Present Illness: Patient is a 49 y.o. H5E5995 presents for annual exam. The patient has no complaints today.   LMP: No LMP recorded. (Menstrual status: Perimenopausal). Average Interval: {Desc; regular/irreg:14544}, {numbers 22-35:14824} days Duration of flow: {numbers; 0-10:33138} days Heavy Menses: {yes/no:63} Clots: {yes/no:63} Intermenstrual Bleeding: {yes/no:63} Postcoital Bleeding: {yes/no:63} Dysmenorrhea: {yes/no:63}   The patient {sys sexually active:13135} sexually active. She currently uses {method:5051} for contraception. She {has/denies:315300} dyspareunia.  The patient {DOES_DOES WNU:81435} perform self breast exams.  There {is/is no:19420} notable family history of breast or ovarian cancer in her family.  The patient wears seatbelts: {yes/no:63}.   The patient has regular exercise: {yes/no/not asked:9010}.    The patient {Blank single:19197::reports,denies} current symptoms of depression.    Review of Systems: ROS  Past Medical History:  Patient Active Problem List   Diagnosis Date Noted Date Diagnosed   AAA (abdominal aortic aneurysm) 07/06/2023    Premature menopause 05/05/2022    BV (bacterial vaginosis) 05/05/2022    Amenorrhea 01/24/2020    Family history of ovarian cancer 01/24/2020    External hemorrhoid 01/24/2020    Hidradenitis 05/31/2018     Formatting of this note might be different from the original. History of multiple episodes    Sepsis (HCC) 05/09/2018     Past Surgical History:  Past Surgical History:  Procedure Laterality Date   CHOLECYSTECTOMY     TUBAL LIGATION     WOUND DEBRIDEMENT Bilateral 05/27/2020   Procedure: DEBRIDEMENT WOUND;  Surgeon: Dessa Reyes ORN, MD;  Location: [facility] ORS;  Service: General;  Laterality: Bilateral;  debridement hidradenitis right axilla and left chest wall    Gynecologic History:   No LMP recorded. (Menstrual status: Perimenopausal). Contraception: {method:5051} Last Pap: Results were: *** {Findings; lab pap smear results:16707::NIL and HR HPV+,NIL and HR HPV negative}  Last mammogram: *** Results were: {Blank single:19197::***,BI-RADS IV,BI-RAD III,BI-RAD II,BI-RAD I}  Obstetric History: H5E5995  Family History:  Family History  Problem Relation Age of Onset   Hypertension Mother    Stroke Mother    Heart disease Father    Hypertension Father    Ovarian cancer Sister 60       1/2 sister on pat side   Breast cancer Maternal Aunt 36    Social History:  Social History   Socioeconomic History   Marital status: Single    Spouse name: Not on file   Number of children: Not on file   Years of education: Not on file   Highest education level: Not on file  Occupational History   Not on file  Tobacco Use   Smoking status: Former    Types: Cigarettes   Smokeless tobacco: Never   Tobacco comments:    Smoked for 2 years.  Vaping Use   Vaping status: Never Used  Substance and Sexual Activity   Alcohol use: Not Currently   Drug use: Never   Sexual activity: Yes    Partners: Male    Birth control/protection: Surgical    Comment: Tubal Ligation  Other Topics Concern   Not on file  Social History Narrative   Not on file   Social Drivers of Health   Tobacco Use: Medium Risk (09/11/2024)   Received from [facility]   Patient History    Smoking Tobacco Use: Former    Smokeless Tobacco Use: Never    Passive Exposure: Not on  file  Financial Resource Strain: Low Risk  (08/17/2024)   Received from [facility]   Overall Financial Resource Strain (CARDIA)    Difficulty of Paying Living Expenses: Not very hard  Food Insecurity: No Food Insecurity (08/17/2024)   Received from [facility]   Epic    Within the past 12 months, you worried that your food would run out before you got the money  to buy more.: Never true    Within the past 12 months, the food you bought just didn't last and you didn't have money to get more.: Never true  Transportation Needs: No Transportation Needs (08/17/2024)   Received from [facility] - Transportation    In the past 12 months, has lack of transportation kept you from medical appointments or from getting medications?: No    Lack of Transportation (Non-Medical): No  Physical Activity: Insufficiently Active (09/15/2023)   Received from [facility]   Exercise Vital Sign    On average, how many days per week do you engage in moderate to strenuous exercise (like a brisk walk)?: 1 day    On average, how many minutes do you engage in exercise at this level?: 20 min  Stress: No Stress Concern Present (09/15/2023)   Received from [facility] - Occupational Stress Questionnaire    Feeling of Stress : Only a little  Social Connections: Moderately Isolated (09/15/2023)   Received from [facility]   Social Connection and Isolation Panel    In a typical week, how many times do you talk on the phone with family, friends, or neighbors?: Three times a week    How often do you get together with friends or relatives?: Once a week    How often do you attend church or religious services?: 1 to 4 times per year    Do you belong to any clubs or organizations such as church groups, unions, fraternal or athletic groups, or school groups?: No    How often do you attend meetings of the clubs or organizations you belong to?: Never    Are you married, widowed, divorced, separated, never married, or living with a partner?: Never married  Intimate Partner Violence: Not on file  Depression (PHQ2-9): Not on file  Alcohol Screen: Not on file  Housing: Low Risk  (08/17/2024)   Received from [facility]   Epic    In the last 12 months,  was there a time when you were not able to pay the mortgage or rent on time?: No    In the past 12 months, how many times have you moved where you were living?: 0    At any time in the past 12 months, were you homeless or living in a shelter (including now)?: No  Utilities: Not At Risk (08/17/2024)   Received from [facility]   Epic    In the past 12 months has the electric, gas, oil, or water company threatened to shut off services in your home?: No  Health Literacy: Adequate Health Literacy (09/15/2023)   Received from [facility]   B1300 Health Literacy    Frequency of need for help with medical instructions: Never    Allergies:  Allergies[1]  Medications: Prior to Admission medications  Medication Sig Start Date End Date Taking? Authorizing Provider  albuterol  (VENTOLIN  HFA) 108 (90 Base) MCG/ACT  inhaler Inhale 2 puffs into the lungs every 6 (six) hours as needed for wheezing or shortness of breath. 05/26/24   White, Shelba SAUNDERS, NP  benzonatate  (TESSALON ) 100 MG capsule Take 1 capsule (100 mg total) by mouth every 8 (eight) hours. Patient not taking: Reported on 03/06/2024 10/02/23   Teresa Shelba SAUNDERS, NP  cholecalciferol (VITAMIN D3) 25 MCG (1000 UNIT) tablet Take 1,000 Units by mouth daily.    [provider]  clindamycin  (CLEOCIN ) 300 MG capsule Take 300 mg by mouth every 6 (six) hours. Patient not taking: Reported on 10/11/2023 07/13/23   [provider]  doxycycline  (VIBRAMYCIN ) 100 MG capsule Take 1 capsule (100 mg total) by mouth 2 (two) times daily. Patient not taking: Reported on 03/06/2024 10/11/23   Teresa Shelba SAUNDERS, NP  ibuprofen  (ADVIL ) 800 MG tablet Take 800 mg by mouth every 6 (six) hours as needed. Patient not taking: Reported on 03/06/2024 07/13/23   [provider]  magnesium oxide (MAG-OX) 400 (240 Mg) MG tablet Take 400 mg by mouth daily.    [provider]  pantoprazole  (PROTONIX ) 20 MG tablet Take by  mouth. 08/02/23   [provider]  predniSONE  (STERAPRED UNI-PAK 21 TAB) 10 MG (21) TBPK tablet Take by mouth daily. Take 6 tabs by mouth daily  for 1 days, then 5 tabs for 1 days, then 4 tabs for 1 days, then 3 tabs for 1 days, 2 tabs for 1 days, then 1 tab by mouth daily for 1 days 05/26/24   Teresa Shelba SAUNDERS, NP  promethazine -dextromethorphan (PROMETHAZINE -DM) 6.25-15 MG/5ML syrup Take 5 mLs by mouth 4 (four) times daily as needed. 05/26/24   Teresa Shelba SAUNDERS, NP    Physical Exam Vitals: There were no vitals taken for this visit.  General: NAD HEENT: normocephalic, anicteric Thyroid : no enlargement, no palpable nodules Pulmonary: No increased work of breathing, CTAB Cardiovascular: RRR, distal pulses 2+ Breast: Breast symmetrical, no tenderness, no palpable nodules or masses, no skin or nipple retraction present, no nipple discharge.  No axillary or supraclavicular lymphadenopathy. Abdomen: NABS, soft, non-tender, non-distended.  Umbilicus without lesions.  No hepatomegaly, splenomegaly or masses palpable. No evidence of hernia  Genitourinary:  External: Normal external female genitalia.  Normal urethral meatus, normal Bartholin's and Skene's glands.    Vagina: Normal vaginal mucosa, no evidence of prolapse.    Cervix: Grossly normal in appearance, no bleeding  Uterus: Non-enlarged, mobile, normal contour.  No CMT  Adnexa: ovaries non-enlarged, no adnexal masses  Rectal: deferred  Lymphatic: no evidence of inguinal lymphadenopathy Extremities: no edema, erythema, or tenderness Neurologic: Grossly intact Psychiatric: mood appropriate, affect full  Female chaperone present for pelvic and breast  portions of the physical exam    Assessment: 49 y.o. H5E5995 routine annual exam  Plan: Problem List Items Addressed This Visit   None   1) Mammogram - recommend yearly screening mammogram.  Mammogram {Blank single:19197::Is up to date,Was ordered today}   2) STI  screening  {Blank single:19197::was,was not}offered and {Blank single:19197::accepted,declined,therefore not obtained}  3) ASCCP guidelines and rational discussed.  Patient opts for {Blank single:19197::***,every 5 years,every 3 years,yearly,discontinue age >65,discontinue secondary to prior hysterectomy} screening interval  4) Contraception - the patient is currently using  {method:5051}.  She is {Blank single:19197::happy with her current form of contraception and plans to continue,interested in changing to ***,interested in starting Contraception: ***,not currently in need of contraception secondary to being sterile,attempting to conceive in the near future}  5) Colonoscopy -- Screening recommended starting  at age 42 for average risk individuals, age 4 for individuals deemed at increased risk (including African Americans) and recommended to continue until age 6.  For patient age 14-85 individualized approach is recommended.  Gold standard screening is via colonoscopy, Cologuard screening is an acceptable alternative for patient unwilling or unable to undergo colonoscopy.  Colorectal cancer screening for average?risk adults: 2018 guideline update from the American Cancer SocietyCA: A Cancer Journal for Clinicians: Feb 24, 2017   6) Routine healthcare maintenance including cholesterol, diabetes screening discussed {Blank single:19197::managed by PCP,Ordered today,To return fasting at a later date,Declines}  7) No follow-ups on file.  Jinnie Cookey, CNM  [facility] 10/10/2024, 2:21 PM            [1]  Allergies Allergen Reactions   Penicillin G Anaphylaxis   Penicillins Hives, Itching, Swelling and Anaphylaxis    Has patient had a PCN reaction causing immediate rash, facial/tongue/throat swelling, SOB or lightheadedness with hypotension: Yes  Has patient had a PCN reaction causing severe rash involving mucus membranes or skin necrosis:  Unknown  Has patient had a PCN reaction that required hospitalization: Unknown  Has patient had a PCN reaction occurring within the last 10 years: Unknown  If all of the above answers are NO, then may proceed with Cephalosporin use.  Has patient had a PCN reaction causing immediate rash, facial/tongue/throat swelling, SOB or lightheadedness with hypotension: Yes  Has patient had a PCN reaction causing severe rash involving mucus membranes or skin necrosis: Unknown  Has patient had a PCN reaction that required hospitalization: Unknown  Has patient had a PCN reaction occurring within the last 10 years: Unknown  If all of the above answers are NO, then may proceed with Cephalosporin use.   Sulfa Antibiotics Anaphylaxis and Swelling   Morphine And Codeine Other (See Comments)    Restless legs   "

## 2024-10-12 ENCOUNTER — Ambulatory Visit: Admitting: Licensed Practical Nurse

## 2024-10-23 ENCOUNTER — Ambulatory Visit: Admitting: Licensed Practical Nurse

## 2024-12-01 ENCOUNTER — Ambulatory Visit: Admitting: Licensed Practical Nurse
